# Patient Record
Sex: Male | Born: 1965
Health system: Southern US, Community
[De-identification: ages and names within clinical notes are randomized; demographics above are authoritative.]

## PROBLEM LIST (undated history)

## (undated) DIAGNOSIS — I10 Essential (primary) hypertension: Secondary | ICD-10-CM

## (undated) DIAGNOSIS — E785 Hyperlipidemia, unspecified: Secondary | ICD-10-CM

## (undated) DIAGNOSIS — N184 Chronic kidney disease, stage 4 (severe): Secondary | ICD-10-CM

## (undated) DIAGNOSIS — E119 Type 2 diabetes mellitus without complications: Secondary | ICD-10-CM

---

## 2017-12-20 ENCOUNTER — Encounter (HOSPITAL_BASED_OUTPATIENT_CLINIC_OR_DEPARTMENT_OTHER): Payer: Self-pay | Admitting: Emergency Medicine

## 2017-12-20 ENCOUNTER — Other Ambulatory Visit: Payer: Self-pay

## 2017-12-20 ENCOUNTER — Emergency Department (HOSPITAL_BASED_OUTPATIENT_CLINIC_OR_DEPARTMENT_OTHER): Payer: Medicaid Other

## 2017-12-20 ENCOUNTER — Inpatient Hospital Stay (HOSPITAL_BASED_OUTPATIENT_CLINIC_OR_DEPARTMENT_OTHER)
Admission: EM | Admit: 2017-12-20 | Discharge: 2017-12-22 | DRG: 638 | Disposition: A | Discharge status: Home / Self Care | Payer: Medicaid Other | Attending: Internal Medicine | Admitting: Internal Medicine

## 2017-12-20 DIAGNOSIS — N179 Acute kidney failure, unspecified: Secondary | ICD-10-CM | POA: Diagnosis present

## 2017-12-20 DIAGNOSIS — Z23 Encounter for immunization: Secondary | ICD-10-CM | POA: Diagnosis not present

## 2017-12-20 DIAGNOSIS — W19XXXA Unspecified fall, initial encounter: Secondary | ICD-10-CM | POA: Diagnosis present

## 2017-12-20 DIAGNOSIS — E440 Moderate protein-calorie malnutrition: Secondary | ICD-10-CM

## 2017-12-20 DIAGNOSIS — Z833 Family history of diabetes mellitus: Secondary | ICD-10-CM | POA: Diagnosis not present

## 2017-12-20 DIAGNOSIS — Z681 Body mass index (BMI) 19 or less, adult: Secondary | ICD-10-CM | POA: Diagnosis not present

## 2017-12-20 DIAGNOSIS — E1165 Type 2 diabetes mellitus with hyperglycemia: Secondary | ICD-10-CM | POA: Diagnosis present

## 2017-12-20 DIAGNOSIS — D509 Iron deficiency anemia, unspecified: Secondary | ICD-10-CM | POA: Diagnosis present

## 2017-12-20 DIAGNOSIS — R739 Hyperglycemia, unspecified: Secondary | ICD-10-CM | POA: Diagnosis present

## 2017-12-20 HISTORY — DX: Type 2 diabetes mellitus without complications: E11.9

## 2017-12-20 LAB — URINALYSIS, ROUTINE W REFLEX MICROSCOPIC
Bilirubin Urine: NEGATIVE
KETONES UR: NEGATIVE mg/dL
Nitrite: NEGATIVE
PROTEIN: 100 mg/dL — AB
Specific Gravity, Urine: 1.01 (ref 1.005–1.030)
pH: 5.5 (ref 5.0–8.0)

## 2017-12-20 LAB — CBG MONITORING, ED
GLUCOSE-CAPILLARY: 552 mg/dL — AB (ref 65–99)
Glucose-Capillary: 591 mg/dL (ref 65–99)
Glucose-Capillary: 477 mg/dL — ABNORMAL HIGH (ref 65–99)

## 2017-12-20 LAB — BASIC METABOLIC PANEL
ANION GAP: 9 (ref 5–15)
BUN: 80 mg/dL — ABNORMAL HIGH (ref 6–20)
CALCIUM: 8.4 mg/dL — AB (ref 8.9–10.3)
CO2: 16 mmol/L — AB (ref 22–32)
Chloride: 110 mmol/L (ref 101–111)
Creatinine, Ser: 2.61 mg/dL — ABNORMAL HIGH (ref 0.61–1.24)
GFR, EST AFRICAN AMERICAN: 31 mL/min — AB (ref >60)
GFR, EST NON AFRICAN AMERICAN: 27 mL/min — AB (ref >60)
Glucose, Bld: 336 mg/dL — ABNORMAL HIGH (ref 65–99)
Potassium: 3.7 mmol/L (ref 3.5–5.1)
SODIUM: 135 mmol/L (ref 135–145)

## 2017-12-20 LAB — COMPREHENSIVE METABOLIC PANEL
ALK PHOS: 153 U/L — AB (ref 38–126)
ALT: 21 U/L (ref 17–63)
ANION GAP: 9 (ref 5–15)
AST: 21 U/L (ref 15–41)
Albumin: 2.4 g/dL — ABNORMAL LOW (ref 3.5–5.0)
BILIRUBIN TOTAL: 0.2 mg/dL — AB (ref 0.3–1.2)
BUN: 84 mg/dL — ABNORMAL HIGH (ref 6–20)
CALCIUM: 8.4 mg/dL — AB (ref 8.9–10.3)
CO2: 17 mmol/L — ABNORMAL LOW (ref 22–32)
CREATININE: 2.79 mg/dL — AB (ref 0.61–1.24)
Chloride: 98 mmol/L — ABNORMAL LOW (ref 101–111)
GFR, EST AFRICAN AMERICAN: 29 mL/min — AB (ref >60)
GFR, EST NON AFRICAN AMERICAN: 25 mL/min — AB (ref >60)
Glucose, Bld: 617 mg/dL (ref 65–99)
Potassium: 3.9 mmol/L (ref 3.5–5.1)
Sodium: 124 mmol/L — ABNORMAL LOW (ref 135–145)
TOTAL PROTEIN: 6.5 g/dL (ref 6.5–8.1)

## 2017-12-20 LAB — URINALYSIS, MICROSCOPIC (REFLEX)

## 2017-12-20 LAB — CBC WITH DIFFERENTIAL/PLATELET
BASOS ABS: 0 10*3/uL (ref 0.0–0.1)
BASOS PCT: 1 %
EOS ABS: 0.7 10*3/uL (ref 0.0–0.7)
EOS PCT: 9 %
HCT: 31.1 % — ABNORMAL LOW (ref 39.0–52.0)
HEMOGLOBIN: 10.7 g/dL — AB (ref 13.0–17.0)
Lymphocytes Relative: 15 %
Lymphs Abs: 1.2 10*3/uL (ref 0.7–4.0)
MCH: 26.4 pg (ref 26.0–34.0)
MCHC: 34.4 g/dL (ref 30.0–36.0)
MCV: 76.6 fL — ABNORMAL LOW (ref 78.0–100.0)
Monocytes Absolute: 0.4 10*3/uL (ref 0.1–1.0)
Monocytes Relative: 5 %
NEUTROS PCT: 70 %
Neutro Abs: 5.7 10*3/uL (ref 1.7–7.7)
PLATELETS: 524 10*3/uL — AB (ref 150–400)
RBC: 4.06 MIL/uL — AB (ref 4.22–5.81)
RDW: 12.6 % (ref 11.5–15.5)
WBC: 8 10*3/uL (ref 4.0–10.5)

## 2017-12-20 LAB — PHOSPHORUS: Phosphorus: 3.6 mg/dL (ref 2.5–4.6)

## 2017-12-20 LAB — BRAIN NATRIURETIC PEPTIDE: B NATRIURETIC PEPTIDE 5: 74.6 pg/mL (ref 0.0–100.0)

## 2017-12-20 LAB — TROPONIN I: Troponin I: <0.03 ng/mL (ref <0.03)

## 2017-12-20 LAB — MAGNESIUM: MAGNESIUM: 1.8 mg/dL (ref 1.7–2.4)

## 2017-12-20 LAB — GLUCOSE, CAPILLARY: Glucose-Capillary: 374 mg/dL — ABNORMAL HIGH (ref 65–99)

## 2017-12-20 MED ORDER — HEPARIN SODIUM (PORCINE) 5000 UNIT/ML IJ SOLN
5000.0000 [IU] | Freq: Three times a day (TID) | INTRAMUSCULAR | Status: DC
  Administered 2017-12-21 – 2017-12-22 (×5): 5000 [IU] via SUBCUTANEOUS
  Filled 2017-12-20 (×5): qty 1

## 2017-12-20 MED ORDER — PNEUMOCOCCAL VAC POLYVALENT 25 MCG/0.5ML IJ INJ
0.5000 mL | INJECTION | INTRAMUSCULAR | Status: AC
  Administered 2017-12-21: 0.5 mL via INTRAMUSCULAR
  Filled 2017-12-20 (×2): qty 0.5

## 2017-12-20 MED ORDER — ACETAMINOPHEN 650 MG RE SUPP: 650.0000 mg | Freq: Four times a day (QID) | RECTAL | Status: DC | PRN

## 2017-12-20 MED ORDER — ONDANSETRON HCL 4 MG/2ML IJ SOLN: 4.0000 mg | Freq: Four times a day (QID) | INTRAMUSCULAR | Status: DC | PRN

## 2017-12-20 MED ORDER — DEXTROSE 50 % IV SOLN: 25.0000 mL | INTRAVENOUS | Status: DC | PRN

## 2017-12-20 MED ORDER — SODIUM CHLORIDE 0.9% FLUSH
3.0000 mL | Freq: Two times a day (BID) | INTRAVENOUS | Status: DC
  Administered 2017-12-21 – 2017-12-22 (×4): 3 mL via INTRAVENOUS

## 2017-12-20 MED ORDER — SODIUM CHLORIDE 0.9 % IV BOLUS (SEPSIS)
1000.0000 mL | Freq: Once | INTRAVENOUS | Status: AC
  Administered 2017-12-20: 1000 mL via INTRAVENOUS

## 2017-12-20 MED ORDER — SODIUM CHLORIDE 0.9 % IV SOLN
INTRAVENOUS | Status: DC
  Administered 2017-12-20: 18:00:00 via INTRAVENOUS

## 2017-12-20 MED ORDER — SODIUM CHLORIDE 0.9 % IV SOLN
INTRAVENOUS | Status: DC
  Administered 2017-12-20: 5.4 [IU]/h via INTRAVENOUS
  Filled 2017-12-20 (×2): qty 1

## 2017-12-20 MED ORDER — SODIUM CHLORIDE 0.9 % IV BOLUS (SEPSIS)
500.0000 mL | Freq: Once | INTRAVENOUS | Status: AC
  Administered 2017-12-20: 500 mL via INTRAVENOUS

## 2017-12-20 MED ORDER — ACETAMINOPHEN 325 MG PO TABS: 650.0000 mg | ORAL_TABLET | Freq: Four times a day (QID) | ORAL | Status: DC | PRN

## 2017-12-20 MED ORDER — ONDANSETRON HCL 4 MG PO TABS: 4.0000 mg | ORAL_TABLET | Freq: Four times a day (QID) | ORAL | Status: DC | PRN

## 2017-12-20 MED ORDER — DEXTROSE-NACL 5-0.45 % IV SOLN
INTRAVENOUS | Status: DC
  Administered 2017-12-21: via INTRAVENOUS

## 2017-12-20 NOTE — H&P (Signed)
[facility] History and Physical  Demont Schloesser ZOX:[id number] DOB: 09/04/1966 DOA: 12/20/2017  Referring physician:  PCP: Patient, No Pcp Per   Chief Complaint: "I got weak and fell down."  HPI: Majesty Wishart is a [age] y.o. male  No prev pmh present presented with weakness and fall.  Patient states that over the last several weeks he has gotten very weak.  He is also lost 30 pounds over the last year.  He has been suspicious that he might have diabetes.  He has a sister who has diabetes.  A fall the day of admission scared him.  Patient did not hit his head or fall violently.  Everything just got dark and patient got weak and he went to the ground.  ED course: Patient found to have glucose above 600.  Not in DKA.  Started on insulin drip.  Hospitalist consulted for admission.  Used language line, patient's primary language is Laotian   Review of Systems:  As per HPI otherwise 10 point review of systems negative.    Past Medical History:  Diagnosis Date  . Diabetes mellitus without complication (HCC)    History reviewed. No pertinent surgical history. Social History:  reports that he has never smoked. He has never used smokeless tobacco. His alcohol and drug histories are not on file.  No Known Allergies  No family history on file.   Prior to Admission medications   Not on File   Physical Exam: Vitals:   12/20/17 2030 12/20/17 2126 12/20/17 2129 12/20/17 2200  BP: (!) 150/88  138/85 (!) 146/96  Pulse: 88 94 99 95  Resp: 18 15 (!) 25 15  Temp:      TempSrc:      SpO2: 100% 99% 99% 100%  Weight:      Height:        Wt Readings from Last 3 Encounters:  12/20/17 39 kg (86 lb)    General:  Appears calm and comfortable; A&Ox3 Eyes:  PERRL, EOMI, normal lids, iris ENT:  grossly normal hearing, lips & tongue Neck:  no LAD, masses or thyromegaly Cardiovascular:  RRR, no m/r/g. No LE edema.  Respiratory:  CTA bilaterally, no w/r/r. Normal  respiratory effort. Abdomen:  soft, ntnd Skin:  no rash or induration seen on limited exam Musculoskeletal:  grossly normal tone BUE/BLE Psychiatric:  grossly normal mood and affect, speech fluent and appropriate Neurologic:  CN 2-12 grossly intact, moves all extremities in coordinated fashion.          Labs on Admission:  Basic Metabolic Panel: Recent Labs  Lab 12/20/17 1707  NA 124*  K 3.9  CL 98*  CO2 17*  GLUCOSE 617*  BUN 84*  CREATININE 2.79*  CALCIUM 8.4*  MG 1.8  PHOS 3.6   Liver Function Tests: Recent Labs  Lab 12/20/17 1707  AST 21  ALT 21  ALKPHOS 153*  BILITOT 0.2*  PROT 6.5  ALBUMIN 2.4*   No results for input(s): LIPASE, AMYLASE in the last 168 hours. No results for input(s): AMMONIA in the last 168 hours. CBC: Recent Labs  Lab 12/20/17 1707  WBC 8.0  NEUTROABS 5.7  HGB 10.7*  HCT 31.1*  MCV 76.6*  PLT 524*   Cardiac Enzymes: Recent Labs  Lab 12/20/17 1707  TROPONINI <0.03    BNP (last 3 results) Recent Labs    12/20/17 1707  BNP 74.6    ProBNP (last 3 results) No results for input(s): PROBNP in the last 8760 hours.  Serum creatinine: 2.79 mg/dL (H) 16/10/96 0454 Estimated creatinine clearance: 17.3 mL/min (A)  CBG: Recent Labs  Lab 12/20/17 1641 12/20/17 1816 12/20/17 1927 12/20/17 2039 12/20/17 2151  GLUCAP 591* >600* 552* 477* 374*    Radiological Exams on Admission: Dg Chest 2 View  Result Date: 12/20/2017 CLINICAL DATA:  Diabetes. EXAM: CHEST - 2 VIEW COMPARISON:  None. FINDINGS: The lungs are clear without focal pneumonia, edema, pneumothorax or pleural effusion. The cardiopericardial silhouette is within normal limits for size. The visualized bony structures of the thorax are intact. Telemetry leads overlie the chest. IMPRESSION: No active cardiopulmonary disease. Electronically Signed   By: Kennith Center M.D.   On: 12/20/2017 17:50    EKG: Independently reviewed. NSR.  Assessment/Plan Active Problems:    Acute hyperglycemia  Hyperglycemia Insulin drip protocol UA->Ucx pending CXR neg  AKI Baseline Cr unk, Cr on admit 2.79 1.5L of normal saline given in the emergency room Gentle hydration overnight Checking magnesium and phosphorus - wnl Urine labs ordered to calculate fractional excretion of Na  Fall PT eval  Low Na Neuro checks Corrected Na 132   Code Status: FC  DVT Prophylaxis: lovenox Family Communication: none available  Disposition Plan: Pending Improvement  Status: sdu inpt  Haydee Salter, MD Family Medicine [facility] [email or web address] Password [email or web address]

## 2017-12-20 NOTE — ED Notes (Signed)
Glucose 617 , given to ED MD

## 2017-12-20 NOTE — ED Notes (Signed)
Eating a bag of chex mix type snack, instructed on NPO until cleared by MD

## 2017-12-20 NOTE — ED Triage Notes (Addendum)
Pt daughter states he is a noncompliant diabetic. Daughter states pt has been declining over the past year but refuses to go to the hospital. He says he agreed to come today because he feels very weak and has difficulty walking. Denies pain. Also reports significant weight loss.

## 2017-12-20 NOTE — Plan of Care (Signed)
[facility] Acceptance Note  52yo M  PMH: DM  HPI: Pt also noted to have months of wt loss. Suspected he has had DM due to fmhx. Pt has also lost vision in one eye. Came in after pushed to come by dgtr.  ED Course: Given NS followed by hyperglycemia protocol and placed insulin drip. No gap.  Requested: Mg, Phos, 1L NS  Status: SDU, inpt

## 2017-12-20 NOTE — ED Notes (Signed)
Patient transported to X-ray 

## 2017-12-20 NOTE — ED Provider Notes (Addendum)
Emergency Department Provider Note   I have reviewed the triage vital signs and the nursing notes.   HISTORY  Chief Complaint Weakness and Hyperglycemia   HPI Nicholas Lucero is a 52 y.o. male with no known PMH presents to the emergency department for evaluation of progressively worsening generalized weakness, vomiting, unintentional weight loss, right eye blindness.  The patient has never been formally diagnosed with diabetes according to him and family.  Patient states he has a family history of diabetes and thought that he probably had it 3 years ago.  Over the last year he has become significantly worse in terms of weakness and unintentional weight loss.  He has never seen a doctor regarding these medical issues.  He denies any fevers or shaking chills.  No blood in the bowel movements.  No shortness of breath or chest pain.  He has never checked his blood sugar.   Past Medical History:  Diagnosis Date  . Diabetes mellitus without complication [facility](HCC)     Patient Active Problem List   Diagnosis Date Noted  . Acute hyperglycemia 12/20/2017    History reviewed. No pertinent surgical history.    Allergies Patient has no known allergies.  No family history on file.  Social History Social History   Tobacco Use  . Smoking status: Never Smoker  . Smokeless tobacco: Never Used  Substance Use Topics  . Alcohol use: Not on file  . Drug use: Not on file    Review of Systems  Constitutional: No fever/chills. Positive generalized weakness.  Eyes: Chronic right eye blindness.  ENT: No sore throat. Cardiovascular: Denies chest pain. Respiratory: Denies shortness of breath. Gastrointestinal: No abdominal pain. Positive nausea and vomiting.  No diarrhea.  No constipation. Genitourinary: Negative for dysuria. Positive urine frequency.  Musculoskeletal: Negative for back pain. Skin: Negative for rash. Neurological: Negative for headaches, focal weakness or  numbness.  10-point ROS otherwise negative.  ____________________________________________   PHYSICAL EXAM:  VITAL SIGNS: ED Triage Vitals  Enc Vitals Group     BP 12/20/17 1637 123/81     Pulse Rate 12/20/17 1637 91     Resp 12/20/17 1637 20     Temp 12/20/17 1637 97.6 F (36.4 C)     Temp Source 12/20/17 1637 Oral     SpO2 12/20/17 1637 97 %     Weight 12/20/17 1638 86 lb (39 kg)     Pain Score 12/20/17 1636 0    Constitutional: Alert and oriented. Patient is thin, frail, and weak-appearing.  Eyes: Conjunctivae are normal. Opaque right lens.  Head: Atraumatic. Nose: No congestion/rhinnorhea. Mouth/Throat: Mucous membranes are very dry.  Neck: No stridor.  Cardiovascular: Normal rate, regular rhythm. Good peripheral circulation. Grossly normal heart sounds.   Respiratory: Normal respiratory effort.  No retractions. Lungs CTAB. Gastrointestinal: Soft and nontender. No distention.  Musculoskeletal: No lower extremity tenderness nor edema. No gross deformities of extremities. Neurologic:  Normal speech and language. No gross focal neurologic deficits are appreciated.  Skin:  Skin is warm, dry and intact. No rash noted.   ____________________________________________   LABS (all labs ordered are listed, but only abnormal results are displayed)  Labs Reviewed  COMPREHENSIVE METABOLIC PANEL - Abnormal; Notable for the following components:      Result Value   Sodium 124 (*)    Chloride 98 (*)    CO2 17 (*)    Glucose, Bld 617 (*)    BUN 84 (*)    Creatinine, Ser 2.79 (*)  Calcium 8.4 (*)    Albumin 2.4 (*)    Alkaline Phosphatase 153 (*)    Total Bilirubin 0.2 (*)    GFR calc non Af Amer 25 (*)    GFR calc Af Amer 29 (*)    All other components within normal limits  CBC WITH DIFFERENTIAL/PLATELET - Abnormal; Notable for the following components:   RBC 4.06 (*)    Hemoglobin 10.7 (*)    HCT 31.1 (*)    MCV 76.6 (*)    Platelets 524 (*)    All other components  within normal limits  URINALYSIS, ROUTINE W REFLEX MICROSCOPIC - Abnormal; Notable for the following components:   Color, Urine STRAW (*)    APPearance CLOUDY (*)    Glucose, UA >=500 (*)    Hgb urine dipstick SMALL (*)    Protein, ur 100 (*)    Leukocytes, UA TRACE (*)    All other components within normal limits  URINALYSIS, MICROSCOPIC (REFLEX) - Abnormal; Notable for the following components:   Bacteria, UA MANY (*)    Squamous Epithelial / LPF 0-5 (*)    All other components within normal limits  GLUCOSE, CAPILLARY - Abnormal; Notable for the following components:   Glucose-Capillary 374 (*)    All other components within normal limits  BASIC METABOLIC PANEL - Abnormal; Notable for the following components:   CO2 16 (*)    Glucose, Bld 336 (*)    BUN 80 (*)    Creatinine, Ser 2.61 (*)    Calcium 8.4 (*)    GFR calc non Af Amer 27 (*)    GFR calc Af Amer 31 (*)    All other components within normal limits  BASIC METABOLIC PANEL - Abnormal; Notable for the following components:   Chloride 114 (*)    CO2 20 (*)    Glucose, Bld 102 (*)    BUN 78 (*)    Creatinine, Ser 2.45 (*)    Calcium 8.6 (*)    GFR calc non Af Amer 29 (*)    GFR calc Af Amer 33 (*)    All other components within normal limits  BASIC METABOLIC PANEL - Abnormal; Notable for the following components:   CO2 18 (*)    Glucose, Bld 149 (*)    BUN 73 (*)    Creatinine, Ser 2.39 (*)    Calcium 8.5 (*)    GFR calc non Af Amer 30 (*)    GFR calc Af Amer 34 (*)    All other components within normal limits  GLUCOSE, CAPILLARY - Abnormal; Notable for the following components:   Glucose-Capillary 254 (*)    All other components within normal limits  GLUCOSE, CAPILLARY - Abnormal; Notable for the following components:   Glucose-Capillary 132 (*)    All other components within normal limits  GLUCOSE, CAPILLARY - Abnormal; Notable for the following components:   Glucose-Capillary 183 (*)    All other  components within normal limits  GLUCOSE, CAPILLARY - Abnormal; Notable for the following components:   Glucose-Capillary 113 (*)    All other components within normal limits  GLUCOSE, CAPILLARY - Abnormal; Notable for the following components:   Glucose-Capillary 100 (*)    All other components within normal limits  GLUCOSE, CAPILLARY - Abnormal; Notable for the following components:   Glucose-Capillary 144 (*)    All other components within normal limits  CBC - Abnormal; Notable for the following components:   RBC 4.06 (*)  Hemoglobin 10.7 (*)    HCT 31.9 (*)    Platelets 562 (*)    All other components within normal limits  GLUCOSE, CAPILLARY - Abnormal; Notable for the following components:   Glucose-Capillary 136 (*)    All other components within normal limits  CBG MONITORING, ED - Abnormal; Notable for the following components:   Glucose-Capillary 591 (*)    All other components within normal limits  CBG MONITORING, ED - Abnormal; Notable for the following components:   Glucose-Capillary >600 (*)    All other components within normal limits  CBG MONITORING, ED - Abnormal; Notable for the following components:   Glucose-Capillary 552 (*)    All other components within normal limits  CBG MONITORING, ED - Abnormal; Notable for the following components:   Glucose-Capillary 477 (*)    All other components within normal limits  MRSA PCR SCREENING  BRAIN NATRIURETIC PEPTIDE  TROPONIN I  MAGNESIUM  PHOSPHORUS  SODIUM, URINE, RANDOM  CREATININE, URINE, RANDOM  GLUCOSE, CAPILLARY  HIV ANTIBODY (ROUTINE TESTING)  HEMOGLOBIN A1C   ____________________________________________  EKG   EKG Interpretation  Date/Time:  Sunday December 20 2017 17:34:24 EDT Ventricular Rate:  89 PR Interval:    QRS Duration: 66 QT Interval:  449 QTC Calculation: 547 R Axis:   89 Text Interpretation:  Sinus rhythm Borderline T abnormalities, diffuse leads Prolonged QT interval Baseline wander  in lead(s) V6 No STEMI.  Confirmed by Alona Bene [phone]) on 12/20/2017 5:55:59 PM Also confirmed by Alona Bene [phone]), editor Elita Quick [phone])  on 12/21/2017 7:34:49 AM       ____________________________________________  RADIOLOGY  Dg Chest 2 View  Result Date: 12/20/2017 CLINICAL DATA:  Diabetes. EXAM: CHEST - 2 VIEW COMPARISON:  None. FINDINGS: The lungs are clear without focal pneumonia, edema, pneumothorax or pleural effusion. The cardiopericardial silhouette is within normal limits for size. The visualized bony structures of the thorax are intact. Telemetry leads overlie the chest. IMPRESSION: No active cardiopulmonary disease. Electronically Signed   By: Kennith Center M.D.   On: 12/20/2017 17:50   US Renal  Result Date: 12/21/2017 CLINICAL DATA:  Acute kidney injury. EXAM: RENAL / URINARY TRACT ULTRASOUND COMPLETE COMPARISON:  None. FINDINGS: Right Kidney: Length: 13.0 cm. Increased parenchymal echogenicity. No mass or hydronephrosis visualized. Left Kidney: Length: 10.5 cm. Increased parenchymal echogenicity. No mass or hydronephrosis visualized. Bladder: Diffuse bladder wall thickening.  Debris in the bladder lumen. IMPRESSION: 1. Echogenic kidneys compatible with medical renal disease. No hydronephrosis. 2. Bladder wall thickening and debris, possibly reflecting infectious cystitis. Electronically Signed   By: Sebastian Ache M.D.   On: 12/21/2017 10:53    ____________________________________________   PROCEDURES  Procedure(s) performed:   Procedures  CRITICAL CARE Performed by: Maia Plan Total critical care time: 35 minutes Critical care time was exclusive of separately billable procedures and treating other patients. Critical care was necessary to treat or prevent imminent or life-threatening deterioration. Critical care was time spent personally by me on the following activities: development of treatment plan with patient and/or surrogate as well as nursing,  discussions with consultants, evaluation of patient's response to treatment, examination of patient, obtaining history from patient or surrogate, ordering and performing treatments and interventions, ordering and review of laboratory studies, ordering and review of radiographic studies, pulse oximetry and re-evaluation of patient's condition.  Alona Bene, MD Emergency Medicine  ____________________________________________   INITIAL IMPRESSION / ASSESSMENT AND PLAN / ED COURSE  Pertinent labs & imaging results that were available  during my care of the patient were reviewed by me and considered in my medical decision making (see chart for details).  Patient is a very thin man with generalized weakness and apparent significant applications from likely Malaquias Lenker-standing diabetes.  His blood sugar is significantly elevated here.  He does have a heart murmur on exam.  No abdominal tenderness or distention.  I suspect the most of his symptoms are related to untreated diabetes.  Plan for IV fluids, labs to rule out DKA and reassessment.  Patient with no DKA but CO2 is 17 and patient with severe DM symptoms and concern for possible developing DKA with nasuea/vomiting. Starting additional IVF and glucose infusion.   Discussed patient's case with Hospitalist, Dr. Melynda Ripple to request admission. Patient and family (if present) updated with plan. Care transferred to Hospitalist service.  I reviewed all nursing notes, vitals, pertinent old records, EKGs, labs, imaging (as available).  ____________________________________________  FINAL CLINICAL IMPRESSION(S) / ED DIAGNOSES  Final diagnoses:  Hyperglycemia     MEDICATIONS GIVEN DURING THIS VISIT:  Medications  dextrose 50 % solution 25 mL (has no administration in time range)  pneumococcal 23 valent vaccine (PNU-IMMUNE) injection 0.5 mL (has no administration in time range)  heparin injection 5,000 Units (5,000 Units Subcutaneous Given 12/21/17 0529)    sodium chloride flush (NS) 0.9 % injection 3 mL (3 mLs Intravenous Given 12/21/17 1058)  acetaminophen (TYLENOL) tablet 650 mg (has no administration in time range)    Or  acetaminophen (TYLENOL) suppository 650 mg (has no administration in time range)  ondansetron (ZOFRAN) tablet 4 mg (has no administration in time range)    Or  ondansetron (ZOFRAN) injection 4 mg (has no administration in time range)  insulin glargine (LANTUS) injection 10 Units (has no administration in time range)  insulin aspart (novoLOG) injection 0-15 Units (2 Units Subcutaneous Given 12/21/17 0800)  0.45 % sodium chloride infusion ( Intravenous New Bag/Given 12/21/17 1058)  sodium chloride 0.9 % bolus 500 mL (0 mLs Intravenous Stopped 12/20/17 1809)  sodium chloride 0.9 % bolus 1,000 mL (0 mLs Intravenous Stopped 12/20/17 2004)  insulin glargine (LANTUS) injection 5 Units (5 Units Subcutaneous Given 12/21/17 0529)    Note:  This document was prepared using Dragon voice recognition software and may include unintentional dictation errors.  Alona Bene, MD Emergency Medicine    Imberly Troxler, Arlyss Repress, MD 12/21/17 1113    Maia Plan, MD 12/29/17 [phone]

## 2017-12-21 ENCOUNTER — Inpatient Hospital Stay (HOSPITAL_COMMUNITY): Payer: Medicaid Other

## 2017-12-21 ENCOUNTER — Telehealth: Payer: Self-pay

## 2017-12-21 DIAGNOSIS — D509 Iron deficiency anemia, unspecified: Secondary | ICD-10-CM

## 2017-12-21 DIAGNOSIS — R739 Hyperglycemia, unspecified: Secondary | ICD-10-CM

## 2017-12-21 DIAGNOSIS — N179 Acute kidney failure, unspecified: Secondary | ICD-10-CM

## 2017-12-21 LAB — CBC
HCT: 31.9 % — ABNORMAL LOW (ref 39.0–52.0)
Hemoglobin: 10.7 g/dL — ABNORMAL LOW (ref 13.0–17.0)
MCH: 26.4 pg (ref 26.0–34.0)
MCHC: 33.5 g/dL (ref 30.0–36.0)
MCV: 78.6 fL (ref 78.0–100.0)
PLATELETS: 562 10*3/uL — AB (ref 150–400)
RBC: 4.06 MIL/uL — ABNORMAL LOW (ref 4.22–5.81)
RDW: 13.3 % (ref 11.5–15.5)
WBC: 9.8 10*3/uL (ref 4.0–10.5)

## 2017-12-21 LAB — GLUCOSE, CAPILLARY
GLUCOSE-CAPILLARY: 144 mg/dL — AB (ref 65–99)
GLUCOSE-CAPILLARY: 136 mg/dL — AB (ref 65–99)
GLUCOSE-CAPILLARY: 229 mg/dL — AB (ref 65–99)
GLUCOSE-CAPILLARY: 233 mg/dL — AB (ref 65–99)
Glucose-Capillary: 132 mg/dL — ABNORMAL HIGH (ref 65–99)
Glucose-Capillary: 254 mg/dL — ABNORMAL HIGH (ref 65–99)
Glucose-Capillary: 183 mg/dL — ABNORMAL HIGH (ref 65–99)
Glucose-Capillary: 93 mg/dL (ref 65–99)
Glucose-Capillary: 113 mg/dL — ABNORMAL HIGH (ref 65–99)
Glucose-Capillary: 100 mg/dL — ABNORMAL HIGH (ref 65–99)

## 2017-12-21 LAB — BASIC METABOLIC PANEL
ANION GAP: 8 (ref 5–15)
ANION GAP: 8 (ref 5–15)
BUN: 78 mg/dL — ABNORMAL HIGH (ref 6–20)
BUN: 73 mg/dL — ABNORMAL HIGH (ref 6–20)
CALCIUM: 8.6 mg/dL — AB (ref 8.9–10.3)
CALCIUM: 8.5 mg/dL — AB (ref 8.9–10.3)
CO2: 20 mmol/L — ABNORMAL LOW (ref 22–32)
CO2: 18 mmol/L — ABNORMAL LOW (ref 22–32)
Chloride: 114 mmol/L — ABNORMAL HIGH (ref 101–111)
Chloride: 111 mmol/L (ref 101–111)
Creatinine, Ser: 2.45 mg/dL — ABNORMAL HIGH (ref 0.61–1.24)
Creatinine, Ser: 2.39 mg/dL — ABNORMAL HIGH (ref 0.61–1.24)
GFR, EST AFRICAN AMERICAN: 33 mL/min — AB (ref >60)
GFR, EST AFRICAN AMERICAN: 34 mL/min — AB (ref >60)
GFR, EST NON AFRICAN AMERICAN: 29 mL/min — AB (ref >60)
GFR, EST NON AFRICAN AMERICAN: 30 mL/min — AB (ref >60)
Glucose, Bld: 102 mg/dL — ABNORMAL HIGH (ref 65–99)
Glucose, Bld: 149 mg/dL — ABNORMAL HIGH (ref 65–99)
Potassium: 3.6 mmol/L (ref 3.5–5.1)
Potassium: 3.5 mmol/L (ref 3.5–5.1)
Sodium: 142 mmol/L (ref 135–145)
Sodium: 137 mmol/L (ref 135–145)

## 2017-12-21 LAB — HEMOGLOBIN A1C
HEMOGLOBIN A1C: 18.4 % — AB (ref 4.8–5.6)
MEAN PLASMA GLUCOSE: 481.38 mg/dL

## 2017-12-21 LAB — SODIUM, URINE, RANDOM: SODIUM UR: 54 mmol/L

## 2017-12-21 LAB — MRSA PCR SCREENING: MRSA by PCR: NEGATIVE

## 2017-12-21 LAB — HIV ANTIBODY (ROUTINE TESTING W REFLEX): HIV SCREEN 4TH GENERATION: NONREACTIVE

## 2017-12-21 LAB — CREATININE, URINE, RANDOM: Creatinine, Urine: 28.64 mg/dL

## 2017-12-21 MED ORDER — INSULIN ASPART 100 UNIT/ML ~~LOC~~ SOLN: 0.0000 [IU] | SUBCUTANEOUS | Status: DC

## 2017-12-21 MED ORDER — SODIUM CHLORIDE 0.45 % IV SOLN
INTRAVENOUS | Status: AC
  Administered 2017-12-21 (×2): via INTRAVENOUS

## 2017-12-21 MED ORDER — INSULIN GLARGINE 100 UNIT/ML ~~LOC~~ SOLN
5.0000 [IU] | Freq: Once | SUBCUTANEOUS | Status: AC
  Administered 2017-12-21: 5 [IU] via SUBCUTANEOUS
  Filled 2017-12-21: qty 0.05

## 2017-12-21 MED ORDER — INSULIN STARTER KIT- SYRINGES (ENGLISH)
1.0000 | Freq: Once | Status: AC
  Administered 2017-12-21: 1
  Filled 2017-12-21: qty 1

## 2017-12-21 MED ORDER — INSULIN GLARGINE 100 UNIT/ML ~~LOC~~ SOLN
10.0000 [IU] | Freq: Every day | SUBCUTANEOUS | Status: DC
  Administered 2017-12-22: 10 [IU] via SUBCUTANEOUS
  Filled 2017-12-21: qty 0.1

## 2017-12-21 MED ORDER — INSULIN ASPART 100 UNIT/ML ~~LOC~~ SOLN
0.0000 [IU] | Freq: Three times a day (TID) | SUBCUTANEOUS | Status: DC
  Administered 2017-12-21 (×2): 5 [IU] via SUBCUTANEOUS
  Administered 2017-12-21: 2 [IU] via SUBCUTANEOUS
  Administered 2017-12-22: 3 [IU] via SUBCUTANEOUS

## 2017-12-21 MED ORDER — LIVING WELL WITH DIABETES BOOK
Freq: Once | Status: AC
  Administered 2017-12-21: 1
  Filled 2017-12-21: qty 1

## 2017-12-21 MED ORDER — HYDRALAZINE HCL 20 MG/ML IJ SOLN
10.0000 mg | Freq: Four times a day (QID) | INTRAMUSCULAR | Status: DC | PRN
  Administered 2017-12-21: 10 mg via INTRAVENOUS
  Filled 2017-12-21: qty 1

## 2017-12-21 NOTE — Progress Notes (Signed)
LCSW consulted for PCP needs and glucose equipment.   LCSW notified RNCM. RNCM will follow up for patient needs.   LCSW signing off. No CSW needs.   Nicholas Lucero, LSCW [facility] [phone]

## 2017-12-21 NOTE — Progress Notes (Signed)
Inpatient Diabetes Program Recommendations  AACE/ADA: New Consensus Statement on Inpatient Glycemic Control (2015)  Target Ranges:  Prepandial:   less than 140 mg/dL      Peak postprandial:   less than 180 mg/dL (1-2 hours)      Critically ill patients:  140 - 180 mg/dL   Lab Results  Component Value Date   GLUCAP 136 (H) 12/21/2017    Review of Glycemic Control  Diabetes history: New onset Outpatient Diabetes medications: None Current orders for Inpatient glycemic control: Lantus 10 units QD, Novolog 0-15 units tidwc  Recently lost 30 pounds. HgbA1C pending. Will likely need meal coverage insulin. Glucose 617 on admission.  Inpatient Diabetes Program Recommendations:     Novolog 3 units tidwc for meal coverage insulin if pt eats > 50% meal.  LM with pt's daughter regarding setting up time to meet with her and pt about his diabetes.  Continue to follow.  Thank you. Ailene Ardshonda Allona Gondek, RD, LDN, CDE Inpatient Diabetes Coordinator [phone]

## 2017-12-21 NOTE — Progress Notes (Signed)
Patient is transferred from ICU at 1330. Alert and oriented x4. Will monitor patient as protocol.

## 2017-12-21 NOTE — Evaluation (Signed)
Physical Therapy Evaluation Patient Details Name: Nicholas Lucero MRN: [id number] DOB: 04-20-1966 Today's Date: 12/21/2017   History of Present Illness  52 year old male from [place] who speaks limited English with the no previously diagnosed medical problems presented with complains of feeling weak and having unsteady gait at home with falls. Patient was found to have a blood glucose greater than 600  Clinical Impression  The patient  Demonstrates mild decreased balance  responses without UE support. Patient indicates some  Decreased sensation of the feet. Pt admitted with above diagnosis. Pt currently with functional limitations due to the deficits listed below (see PT Problem List).  Pt will benefit from skilled PT to increase their independence and safety with mobility to allow discharge to the venue listed below.       Follow Up Recommendations No PT follow up    Equipment Recommendations  Rolling walker with 5" wheels(TBD)    Recommendations for Other Services   OT    Precautions / Restrictions Precautions Precautions: Fall      Mobility  Bed Mobility Overal bed mobility: Independent                Transfers Overall transfer level: Needs assistance Equipment used: None Transfers: Sit to/from Stand Sit to Stand: Supervision            Ambulation/Gait Ambulation/Gait assistance: Min assist Ambulation Distance (Feet): 150 Feet Assistive device: None Gait Pattern/deviations: Drifts right/left;Staggering right;Staggering left     General Gait Details: at times, noted to stagger, steady assist  at times for balance  Stairs            Wheelchair Mobility    Modified Rankin (Stroke Patients Only)       Balance Overall balance assessment: Needs assistance Sitting-balance support: Feet supported;No upper extremity supported Sitting balance-Leahy Scale: Normal     Standing balance support: During functional activity;No upper extremity  supported Standing balance-Leahy Scale: Poor Standing balance comment: at times needs UE support                             Pertinent Vitals/Pain Pain Assessment: No/denies pain    Home Living Family/patient expects to be discharged to:: Private residence Living Arrangements: Other relatives Available Help at Discharge: Family Type of Home: House Home Access: Stairs to enter   Secretary/administrator of Steps: 2-3 Home Layout: One level Home Equipment: None      Prior Function                 Hand Dominance        Extremity/Trunk Assessment   Upper Extremity Assessment Upper Extremity Assessment: Generalized weakness    Lower Extremity Assessment Lower Extremity Assessment: Generalized weakness;RLE deficits/detail;LLE deficits/detail RLE Deficits / Details: reports some decreased sensation of feet LLE Deficits / Details: similar to right       Communication      Cognition Arousal/Alertness: Awake/alert Behavior During Therapy: WFL for tasks assessed/performed Overall Cognitive Status: Within Functional Limits for tasks assessed                                 General Comments: Ria Bush is dialect, understands some english      General Comments      Exercises     Assessment/Plan    PT Assessment Patient needs continued PT services  PT Problem List Decreased strength;Decreased activity tolerance;Decreased balance;Decreased  mobility;Decreased safety awareness;Impaired sensation       PT Treatment Interventions DME instruction;Gait training;Functional mobility training;Therapeutic activities;Patient/family education    PT Goals (Current goals can be found in the Care Plan section)  Acute Rehab PT Goals Patient Stated Goal: agreed to ambulate PT Goal Formulation: With patient Time For Goal Achievement: 12/28/17 Potential to Achieve Goals: Good    Frequency Min 3X/week   Barriers to discharge        Co-evaluation                AM-PAC PT "6 Clicks" Daily Activity  Outcome Measure Difficulty turning over in bed (including adjusting bedclothes, sheets and blankets)?: None Difficulty moving from lying on back to sitting on the side of the bed? : None Difficulty sitting down on and standing up from a chair with arms (e.g., wheelchair, bedside commode, etc,.)?: A Little Help needed moving to and from a bed to chair (including a wheelchair)?: A Little Help needed walking in hospital room?: A Lot Help needed climbing 3-5 steps with a railing? : Total 6 Click Score: 17    End of Session   Activity Tolerance: Patient tolerated treatment well Patient left: in bed;with call bell/phone within reach;with bed alarm set Nurse Communication: Mobility status PT Visit Diagnosis: Unsteadiness on feet (R26.81)    Time: [id number] PT Time Calculation (min) (ACUTE ONLY): 23 min   Charges:   PT Evaluation $PT Eval Low Complexity: 1 Low PT Treatments $Gait Training: 8-22 mins   PT G CodesBlanchard Kelch:        Jacoby Zanni PT [phone]    Rada HayHill, Cira Deyoe Elizabeth 12/21/2017, 6:16 PM

## 2017-12-21 NOTE — Progress Notes (Addendum)
Appointment at [facility] April 3rd at 10:30 AM. Pt may purchase medications there and glucose meter. There are no free glucose meters to give.  Pt and daughter are aware. Pt and daughter also was nicely, deeply, strongly encouraged to keep this appointment, because they are very hard to get.

## 2017-12-21 NOTE — Telephone Encounter (Signed)
Call received from Geni BersMyraette McGibboney, RN CM requesting a hospital follow up appointment for the patient at [facility].  An appointment was scheduled for 12/30/17 @ 1030 and the information was placed on the AVS.

## 2017-12-21 NOTE — Progress Notes (Signed)
[facility] PROGRESS NOTE  Nicholas Lucero:[id number]:[id number] DOB: 08/29/1966 DOA: 12/20/2017  PCP: Nicholas Lucero, No Pcp Per  Brief History/Interval Summary: 52 year old male from [place] who speaks limited English with the no previously diagnosed medical problems presented with complains of feeling weak and having unsteady gait at home with falls.  He had lost about 30 pounds as well.  It is unclear if the Nicholas Lucero knew if he had diabetes or not but apparently he had been "suspicious" about this.  Nicholas Lucero was found to have a blood glucose greater than 600.  Not noted to be in ketoacidosis.  He was started on an insulin infusion and admitted to the hospital.  Also found to have elevated creatinine.  Reason for Visit: Hyperglycemia..  Acute renal failure  Consultants: None  Procedures: None  Antibiotics: None  Subjective/Interval History: Nicholas Lucero states that he feels better this morning.  Denies any nausea vomiting.  Denies any abdominal pain.  ROS: No Shortness of breath  Objective:  Vital Signs  Vitals:   12/21/17 0400 12/21/17 0500 12/21/17 0600 12/21/17 0735  BP: (!) 161/81 135/74 (!) 151/82   Pulse: 88 82 86   Resp: (!) 22 17 17    Temp: 98.1 F (36.7 C)   98 F (36.7 C)  TempSrc: Oral   Oral  SpO2: 99% 99% 99%   Weight:      Height:        Intake/Output Summary (Last 24 hours) at 12/21/2017 1042 Last data filed at 12/21/2017 0900 Gross per 24 hour  Intake 2271.92 ml  Output 1050 ml  Net 1221.92 ml   Filed Weights   12/20/17 1638  Weight: 39 kg (86 lb)    General appearance: alert, cooperative, appears stated age and no distress Head: Normocephalic, without obvious abnormality, atraumatic Resp: clear to auscultation bilaterally Cardio: regular rate and rhythm, S1, S2 normal, no murmur, click, rub or gallop GI: soft, non-tender; bowel sounds normal; no masses,  no organomegaly Extremities: extremities normal, atraumatic, no cyanosis or  edema Neurologic: Awake alert.  Oriented x3.  Cranial nerves II through XII intact.  Motor strength equal bilateral upper and lower extremities.  No pronator drift.  Sensations intact bilaterally.  Gait not assessed.  Lab Results:  Data Reviewed: I have personally reviewed following labs and imaging studies  CBC: Recent Labs  Lab 12/20/17 1707 12/21/17 0300  WBC 8.0 9.8  NEUTROABS 5.7  --   HGB 10.7* 10.7*  HCT 31.1* 31.9*  MCV 76.6* 78.6  PLT 524* 562*    Basic Metabolic Panel: Recent Labs  Lab 12/20/17 1707 12/20/17 2252 12/21/17 0300 12/21/17 0625  NA 124* 135 142 137  K 3.9 3.7 3.6 3.5  CL 98* 110 114* 111  CO2 17* 16* 20* 18*  GLUCOSE 617* 336* 102* 149*  BUN 84* 80* 78* 73*  CREATININE 2.79* 2.61* 2.45* 2.39*  CALCIUM 8.4* 8.4* 8.6* 8.5*  MG 1.8  --   --   --   PHOS 3.6  --   --   --     GFR: Estimated Creatinine Clearance: 20.2 mL/min (A) (by C-G formula based on SCr of 2.39 mg/dL (H)).  Liver Function Tests: Recent Labs  Lab 12/20/17 1707  AST 21  ALT 21  ALKPHOS 153*  BILITOT 0.2*  PROT 6.5  ALBUMIN 2.4*    Cardiac Enzymes: Recent Labs  Lab 12/20/17 1707  TROPONINI <0.03    CBG: Recent Labs  Lab 12/21/17 0238 12/21/17 0335 12/21/17 0437 12/21/17 0604 12/21/17  0732  GLUCAP 93 100* 113* 144* 136*     Recent Results (from the past 240 hour(s))  MRSA PCR Screening     Status: None   Collection Time: 12/20/17  9:30 PM  Result Value Ref Range Status   MRSA by PCR NEGATIVE NEGATIVE Final    Comment:        The GeneXpert MRSA Assay (FDA approved for NASAL specimens only), is one component of a comprehensive MRSA colonization surveillance program. It is not intended to diagnose MRSA infection nor to guide or monitor treatment for MRSA infections. Performed at [facility], [address], [place], [place] [address]       Radiology Studies: Dg Chest 2 View  Result Date: 12/20/2017 CLINICAL DATA:   Diabetes. EXAM: CHEST - 2 VIEW COMPARISON:  None. FINDINGS: The lungs are clear without focal pneumonia, edema, pneumothorax or pleural effusion. The cardiopericardial silhouette is within normal limits for size. The visualized bony structures of the thorax are intact. Telemetry leads overlie the chest. IMPRESSION: No active cardiopulmonary disease. Electronically Signed   By: Kennith Center M.D.   On: 12/20/2017 17:50     Medications:  Scheduled: . heparin  5,000 Units Subcutaneous Q8H  . insulin aspart  0-15 Units Subcutaneous TID WC  . [START ON 12/22/2017] insulin glargine  10 Units Subcutaneous Daily  . pneumococcal 23 valent vaccine  0.5 mL Intramuscular Tomorrow-1000  . sodium chloride flush  3 mL Intravenous Q12H   Continuous: . sodium chloride     UEA:[id number] **OR** acetaminophen, dextrose, ondansetron **OR** ondansetron (ZOFRAN) IV  Assessment/Plan:  Active Problems:   Acute hyperglycemia    Hyperglycemia nonketotic Nicholas Lucero blood glucose level was greater than 600 at admission.  He did not have diabetic ketoacidosis.  He was placed on insulin infusion.  Transition to Lantus this morning.  Continue with sliding scale coverage.  Await HbA1c.  Diabetes coordinator to see.  Lipid panel.  Acute kidney injury normal anion gap metabolic acidosis Baseline renal function is unknown.  Creatinine was 2.79 on admission.  He has elevated BUN as well.  Some of this could be prerenal.  He will be continued on IV fluids.  Monitor urine output.  Renal ultrasound is pending.  Recheck labs tomorrow.  Urine sodium and creatinine were checked however it appears that these were sent off after Nicholas Lucero was given fluids.  Difficult to interpret but based on those values his FENa appears to be 3.78.  Microcytic anemia Baseline hemoglobin is not known.  No overt bleeding.  Check anemia panel.  Stool for occult blood.  He will need further workup as an outpatient.  Generalized weakness Possibly  due to hypoglycemia.  We will also follow-up on anemia panel.  We will check TSH.  PT evaluation.  Not have any focal neurological deficits.  Nicholas Lucero does not have a primary care provider.  Will discuss with case management regarding referring him to community health center.  He will need ongoing care for his diabetes and further workup for his other medical issues as mentioned above.  DVT Prophylaxis: Subcutaneous heparin    Code Status: Full code Family Communication: Discussed with the Nicholas Lucero Disposition Plan: Management as outlined above. PT evaluation.  Okay for transfer to floor.    LOS: 1 day   Osvaldo Shipper  [facility] Pager [phone] 12/21/2017, 10:42 AM  If 7PM-7AM, please contact night-coverage at [email or web address], password [facility]

## 2017-12-22 DIAGNOSIS — E440 Moderate protein-calorie malnutrition: Secondary | ICD-10-CM

## 2017-12-22 LAB — LIPID PANEL
CHOLESTEROL: 227 mg/dL — AB (ref 0–200)
HDL: 49 mg/dL (ref >40)
LDL Cholesterol: 148 mg/dL — ABNORMAL HIGH (ref 0–99)
Total CHOL/HDL Ratio: 4.6 RATIO
Triglycerides: 148 mg/dL (ref <150)
VLDL: 30 mg/dL (ref 0–40)

## 2017-12-22 LAB — VITAMIN B12: VITAMIN B 12: 1063 pg/mL — AB (ref 180–914)

## 2017-12-22 LAB — BASIC METABOLIC PANEL
ANION GAP: 8 (ref 5–15)
BUN: 61 mg/dL — ABNORMAL HIGH (ref 6–20)
CALCIUM: 8.2 mg/dL — AB (ref 8.9–10.3)
CO2: 18 mmol/L — ABNORMAL LOW (ref 22–32)
Chloride: 109 mmol/L (ref 101–111)
Creatinine, Ser: 2.33 mg/dL — ABNORMAL HIGH (ref 0.61–1.24)
GFR, EST AFRICAN AMERICAN: 36 mL/min — AB (ref >60)
GFR, EST NON AFRICAN AMERICAN: 31 mL/min — AB (ref >60)
GLUCOSE: 221 mg/dL — AB (ref 65–99)
Potassium: 4 mmol/L (ref 3.5–5.1)
SODIUM: 135 mmol/L (ref 135–145)

## 2017-12-22 LAB — CBC
HCT: 29.2 % — ABNORMAL LOW (ref 39.0–52.0)
Hemoglobin: 9.7 g/dL — ABNORMAL LOW (ref 13.0–17.0)
MCH: 25.9 pg — ABNORMAL LOW (ref 26.0–34.0)
MCHC: 33.2 g/dL (ref 30.0–36.0)
MCV: 77.9 fL — AB (ref 78.0–100.0)
PLATELETS: 492 10*3/uL — AB (ref 150–400)
RBC: 3.75 MIL/uL — AB (ref 4.22–5.81)
RDW: 13.7 % (ref 11.5–15.5)
WBC: 7.7 10*3/uL (ref 4.0–10.5)

## 2017-12-22 LAB — RETICULOCYTES
RBC.: 3.75 MIL/uL — ABNORMAL LOW (ref 4.22–5.81)
RETIC CT PCT: 2.2 % (ref 0.4–3.1)
Retic Count, Absolute: 82.5 10*3/uL (ref 19.0–186.0)

## 2017-12-22 LAB — IRON AND TIBC
Iron: 75 ug/dL (ref 45–182)
SATURATION RATIOS: 34 % (ref 17.9–39.5)
TIBC: 221 ug/dL — AB (ref 250–450)
UIBC: 146 ug/dL

## 2017-12-22 LAB — TSH: TSH: 2.068 u[IU]/mL (ref 0.350–4.500)

## 2017-12-22 LAB — GLUCOSE, CAPILLARY
GLUCOSE-CAPILLARY: 199 mg/dL — AB (ref 65–99)
Glucose-Capillary: 187 mg/dL — ABNORMAL HIGH (ref 65–99)
Glucose-Capillary: 229 mg/dL — ABNORMAL HIGH (ref 65–99)

## 2017-12-22 LAB — FERRITIN: Ferritin: 202 ng/mL (ref 24–336)

## 2017-12-22 LAB — FOLATE: Folate: 15.2 ng/mL (ref >5.9)

## 2017-12-22 MED ORDER — GLUCERNA SHAKE PO LIQD
237.0000 mL | Freq: Two times a day (BID) | ORAL | Status: DC
  Filled 2017-12-22: qty 237

## 2017-12-22 MED ORDER — ADULT MULTIVITAMIN W/MINERALS CH: 1.0000 | ORAL_TABLET | Freq: Every day | ORAL | 0 refills | Status: AC

## 2017-12-22 MED ORDER — SODIUM BICARBONATE 650 MG PO TABS: 650.0000 mg | ORAL_TABLET | Freq: Two times a day (BID) | ORAL | 1 refills | Status: DC

## 2017-12-22 MED ORDER — ATORVASTATIN CALCIUM 10 MG PO TABS: 10.0000 mg | ORAL_TABLET | Freq: Every day | ORAL | 0 refills | Status: DC

## 2017-12-22 MED ORDER — INSULIN NPH ISOPHANE & REGULAR (70-30) 100 UNIT/ML ~~LOC~~ SUSP: 8.0000 [IU] | Freq: Two times a day (BID) | SUBCUTANEOUS | 3 refills | Status: DC

## 2017-12-22 MED ORDER — "INSULIN SYRINGE/NEEDLE 27G X 1/2"" 0.5 ML MISC": 3 refills | Status: DC

## 2017-12-22 MED ORDER — BLOOD GLUCOSE MONITOR KIT: PACK | 0 refills | Status: DC

## 2017-12-22 NOTE — Discharge Summary (Signed)
[facility]  Physician Discharge Summary   Patient ID: Nicholas Lucero MRN: [id number] DOB/AGE: [birth year] 52 y.o.  Admit date: 12/20/2017 Discharge date: 12/22/2017  PCP: Patient, No Pcp Per  DISCHARGE DIAGNOSES:  Active Problems:   Acute hyperglycemia   Malnutrition of moderate degree   RECOMMENDATIONS FOR OUTPATIENT FOLLOW UP: 1. Patient has been made an appointment in the community health and wellness clinic next week. 2. He has been given prescriptions for insulin and glucometer. 3. He will need to undergo blood work at follow-up to assess his creatinine/renal function   DISCHARGE CONDITION: fair  Diet recommendation: Modified carbohydrate  Filed Weights   12/20/17 1638 12/22/17 0432  Weight: 39 kg (86 lb) 40 kg (88 lb 2.9 oz)    INITIAL HISTORY: 52 year old male from [place] who speaks limited English with the no previously diagnosed medical problems presented with complains of feeling weak and having unsteady gait at home with falls.  He had lost about 30 pounds as well.  It is unclear if the patient knew if he had diabetes or not but apparently he had been "suspicious" about this.  Patient was found to have a blood glucose greater than 600.  Not noted to be in ketoacidosis.  He was started on an insulin infusion and admitted to the hospital.  Also found to have elevated creatinine.   HOSPITAL COURSE:   Newly diagnosed diabetes mellitus type 2 with renal complications Patient blood glucose level was greater than 600 at admission.  He did not have diabetic ketoacidosis.  He was placed on insulin infusion.  He was transitioned to Lantus.  HbA1c was 18.   LDL was noted to be greater than 140.  He will be started on low-dose statin.  No ACE inhibitor due to renal dysfunction.  Due to cost issues he will be discharged on 70/30 insulin as recommended by diabetes coordinator.  He has follow-up at the community health and wellness center next week.    Likely  chronic kidney disease unknown stage with normal anion gap metabolic acidosis Baseline renal function is unknown.  Creatinine was 2.79 on admission.  He has elevated BUN as well.  Some of this could be prerenal.  Patient was given IV fluids.  There was mild improvement in his renal function.  He is making urine.  He likely has chronic kidney disease.  Renal ultrasound did not show any hydronephrosis but did suggest medical renal disease.  He will need continued outpatient follow-up and monitoring.  May benefit from referral to nephrology.  He will also be discharged on sodium bicarbonate tablets.  Microcytic anemia Ferritin is 202.  B12 level 1063.  Folate 15.2.  Further evaluation for anemia as outpatient.  No overt bleeding noted.  Generalized weakness Possibly due to hypoglycemia.    Seen by physical therapy.  Rolling walker will be ordered.  TSH is normal.  Overall stable.  Okay for discharge home today.   PERTINENT LABS:  The results of significant diagnostics from this hospitalization (including imaging, microbiology, ancillary and laboratory) are listed below for reference.    Microbiology: Recent Results (from the past 240 hour(s))  MRSA PCR Screening     Status: None   Collection Time: 12/20/17  9:30 PM  Result Value Ref Range Status   MRSA by PCR NEGATIVE NEGATIVE Final    Comment:        The GeneXpert MRSA Assay (FDA approved for NASAL specimens only), is one component of a comprehensive MRSA colonization surveillance program. It  is not intended to diagnose MRSA infection nor to guide or monitor treatment for MRSA infections. Performed at [facility], [place] [address] [place], [place] [address]      Labs: Basic Metabolic Panel: Recent Labs  Lab 12/20/17 1707 12/20/17 2252 12/21/17 0300 12/21/17 0625 12/22/17 0705  NA 124* 135 142 137 135  K 3.9 3.7 3.6 3.5 4.0  CL 98* 110 114* 111 109  CO2 17* 16* 20* 18* 18*  GLUCOSE 617* 336* 102*  149* 221*  BUN 84* 80* 78* 73* 61*  CREATININE 2.79* 2.61* 2.45* 2.39* 2.33*  CALCIUM 8.4* 8.4* 8.6* 8.5* 8.2*  MG 1.8  --   --   --   --   PHOS 3.6  --   --   --   --    Liver Function Tests: Recent Labs  Lab 12/20/17 1707  AST 21  ALT 21  ALKPHOS 153*  BILITOT 0.2*  PROT 6.5  ALBUMIN 2.4*   CBC: Recent Labs  Lab 12/20/17 1707 12/21/17 0300 12/22/17 0705  WBC 8.0 9.8 7.7  NEUTROABS 5.7  --   --   HGB 10.7* 10.7* 9.7*  HCT 31.1* 31.9* 29.2*  MCV 76.6* 78.6 77.9*  PLT 524* 562* 492*   Cardiac Enzymes: Recent Labs  Lab 12/20/17 1707  TROPONINI <0.03   BNP: BNP (last 3 results) Recent Labs    12/20/17 1707  BNP 74.6    CBG: Recent Labs  Lab 12/21/17 0732 12/21/17 1200 12/21/17 1617 12/21/17 2135 12/22/17 0727  GLUCAP 136* 229* 233* 187* 199*     IMAGING STUDIES Dg Chest 2 View  Result Date: 12/20/2017 CLINICAL DATA:  Diabetes. EXAM: CHEST - 2 VIEW COMPARISON:  None. FINDINGS: The lungs are clear without focal pneumonia, edema, pneumothorax or pleural effusion. The cardiopericardial silhouette is within normal limits for size. The visualized bony structures of the thorax are intact. Telemetry leads overlie the chest. IMPRESSION: No active cardiopulmonary disease. Electronically Signed   By: Misty Stanley M.D.   On: 12/20/2017 17:50   US Renal  Result Date: 12/21/2017 CLINICAL DATA:  Acute kidney injury. EXAM: RENAL / URINARY TRACT ULTRASOUND COMPLETE COMPARISON:  None. FINDINGS: Right Kidney: Length: 13.0 cm. Increased parenchymal echogenicity. No mass or hydronephrosis visualized. Left Kidney: Length: 10.5 cm. Increased parenchymal echogenicity. No mass or hydronephrosis visualized. Bladder: Diffuse bladder wall thickening.  Debris in the bladder lumen. IMPRESSION: 1. Echogenic kidneys compatible with medical renal disease. No hydronephrosis. 2. Bladder wall thickening and debris, possibly reflecting infectious cystitis. Electronically Signed   By: Logan Bores M.D.   On: 12/21/2017 10:53    DISCHARGE EXAMINATION: Vitals:   12/21/17 1345 12/21/17 2023 12/22/17 0431 12/22/17 0432  BP: 119/63 121/70 136/81   Pulse: 96 100 97   Resp: 20 20 18    Temp: 98.2 F (36.8 C) 98.5 F (36.9 C) 98.3 F (36.8 C)   TempSrc: Oral Oral Oral   SpO2: 99% 98% 98%   Weight:    40 kg (88 lb 2.9 oz)  Height:       General appearance: alert, cooperative, appears stated age and no distress Resp: clear to auscultation bilaterally Cardio: regular rate and rhythm, S1, S2 normal, no murmur, click, rub or gallop GI: soft, non-tender; bowel sounds normal; no masses,  no organomegaly Extremities: extremities normal, atraumatic, no cyanosis or edema  DISPOSITION: Home with daughter  Discharge Instructions    Call MD for:  difficulty breathing, headache or visual disturbances  Complete by:  As directed    Call MD for:  extreme fatigue   Complete by:  As directed    Call MD for:  persistant dizziness or light-headedness   Complete by:  As directed    Call MD for:  persistant nausea and vomiting   Complete by:  As directed    Call MD for:  severe uncontrolled pain   Complete by:  As directed    Call MD for:  temperature >100.4   Complete by:  As directed    Discharge instructions   Complete by:  As directed    Drink plenty of fluids.  Take your medications as prescribed.  Check your blood glucose levels 3 times a day before each meal and maintain a log for your doctor.  Please be sure to keep your appointment next week.  Some of your blood work results are not available yet.  The doctor should be able to look these up at follow-up.  Including your iron levels and B12 levels.  You will need blood work at follow-up to check your kidney function.  If your kidney function does not improve over the next several weeks then you may need to be referred to a kidney doctor.  You were cared for by a hospitalist during your hospital stay. If you have any questions about  your discharge medications or the care you received while you were in the hospital after you are discharged, you can call the unit and asked to speak with the hospitalist on call if the hospitalist that took care of you is not available. Once you are discharged, your primary care physician will handle any further medical issues. Please note that NO REFILLS for any discharge medications will be authorized once you are discharged, as it is imperative that you return to your primary care physician (or establish a relationship with a primary care physician if you do not have one) for your aftercare needs so that they can reassess your need for medications and monitor your lab values. If you do not have a primary care physician, you can call [phone] for a physician referral.   Increase activity slowly   Complete by:  As directed         Allergies as of 12/22/2017   No Known Allergies     Medication List    TAKE these medications   atorvastatin 10 MG tablet Commonly known as:  LIPITOR Take 1 tablet (10 mg total) by mouth daily.   blood glucose meter kit and supplies Kit Dispense based on patient and insurance preference. Use up to four times daily as directed. (FOR ICD-9 250.00, 250.01).   INS SYRINGE/NEEDLE .5CC/27G 27G X 1/2" 0.5 ML Misc Use as directed   insulin NPH-regular Human (70-30) 100 UNIT/ML injection Commonly known as:  NOVOLIN 70/30 Inject 8 Units into the skin 2 (two) times daily with a meal.   multivitamin with minerals Tabs tablet Take 1 tablet by mouth daily.   sodium bicarbonate 650 MG tablet Take 1 tablet (650 mg total) by mouth 2 (two) times daily.            Durable Medical Equipment  (From admission, onward)        Start     Ordered   12/22/17 1018  For home use only DME Walker rolling  Once    Question:  Patient needs a walker to treat with the following condition  Answer:  Unsteady gait   12/22/17 1018  Follow-up Information    [facility] Go on 12/30/2017.   Why:  at 10:30 am for a hospital follow up appointment.  Contact information: [place] [phone] [phone]          TOTAL DISCHARGE TIME: 42 mins  Bonnielee Haff  [facility] Pager [phone]  12/22/2017, 1:52 PM

## 2017-12-22 NOTE — Progress Notes (Signed)
Inpatient Diabetes Program Recommendations  AACE/ADA: New Consensus Statement on Inpatient Glycemic Control (2015)  Target Ranges:  Prepandial:   less than 140 mg/dL      Peak postprandial:   less than 180 mg/dL (1-2 hours)      Critically ill patients:  140 - 180 mg/dL   Lab Results  Component Value Date   GLUCAP 199 (H) 12/22/2017   HGBA1C 18.4 (H) 12/21/2017    Review of Glycemic Control   Spoke with patient about new diabetes diagnosis.  Discussed A1C results (18.4%) and explained what an A1C is. Discussed basic pathophysiology of DM Type 2, basic home care, importance of checking CBGs and maintaining good CBG control to prevent long-term and short-term complications. Reviewed glucose and A1C goals and explained that patient will need to continue to  Reviewed signs and symptoms of hyperglycemia and hypoglycemia along with treatment for both. Discussed impact of nutrition, exercise, stress, sickness, and medications on diabetes control.  Pt is able to give himself insulin and check his blood sugars. Feels confidant in giving his insulin and will [facility].   To be discharged on 70/30 8 units bid. Will need prescription for meter and supplies. Insulin syringes.  [facility] as [facility], as pt does not have insurance. Answered questions.  Discussed above with MD.  Thank you. Ailene Ardshonda Calli Bashor, RD, LDN, CDE Inpatient Diabetes Coordinator [phone]

## 2017-12-22 NOTE — Progress Notes (Addendum)
Initial Nutrition Assessment  DOCUMENTATION CODES:   Underweight, Non-severe (moderate) malnutrition in context of chronic illness  INTERVENTION:   Glucerna po BID, each supplement provides 220 calories and 10 grams protein    NUTRITION DIAGNOSIS:   Moderate Malnutrition related to chronic illness(long-term untreated/undiagnosed DM) as evidenced by mild fat depletion, moderate muscle depletion.   GOAL:   Patient will meet greater than or equal to 90% of their needs   MONITOR:   PO intake, Supplement acceptance, Weight trends  REASON FOR ASSESSMENT:   Malnutrition Screening Tool    ASSESSMENT:    52 yo male admitted 3/24 with acute non-ketotic hyperglycemia, acidosis and renal failure secondary to undiagnosed DM, microcytic anemia.  Spoke with RN who reports that pt is eating well and doing better today  Spoke with pt who reports:   He has been gradually losing weight over the past 2-3 years. He used to weigh 110 lb, but does not recall exact date  Believes that wt loss due in part to decreased appetite; only eats one big meal (traditional Vietnamese noodle bowl with chicken)  His appetite is much better now that he is in the hospital; he consumed 100% of breakfast  Denies N/V GI issues  He is concerned about paying for insulin after discharge since he does not have insurance  Learned how to administer insulin to himself  The importance of eating adequate calories with appropriate amounts/types of carbohydrates, consuming adequate protein, eating more frequent meals rather than one big meal was discussed with pt. Pt was receptive to basic information; he would benefit from additional diabetes educations; however, pt did confirm that his daughter was with him during initial diabetes education and she has the booklet of information provided for post-discharge DM management.    Medications: insulin MS & lantus  Labs: CBG's 229, 233, 199  NUTRITION - FOCUSED  PHYSICAL EXAM:    Most Recent Value  Orbital Region  No depletion  Upper Arm Region  Mild depletion  Thoracic and Lumbar Region  No depletion  Buccal Region  No depletion  Temple Region  Mild depletion  Clavicle Bone Region  No depletion  Clavicle and Acromion Bone Region  Moderate depletion  Scapular Bone Region  Moderate depletion  Dorsal Hand  No depletion  Patellar Region  Moderate depletion  Anterior Thigh Region  Moderate depletion  Posterior Calf Region  Moderate depletion  Edema (RD Assessment)  None  Hair  Reviewed  Eyes  Reviewed  Mouth  Reviewed  Skin  Reviewed  Nails  Reviewed       Diet Order:  Diet Carb Modified Fluid consistency: Thin; Room service appropriate? Yes  EDUCATION NEEDS:   Education needs have been addressed  Skin:  Skin Assessment: Reviewed RN Assessment  Last BM:  3/25  Height:   Ht Readings from Last 1 Encounters:  12/20/17 5\' 1"  (1.549 m)    Weight:   Wt Readings from Last 1 Encounters:  12/22/17 88 lb 2.9 oz (40 kg)    Ideal Body Weight:  50.8 kg  BMI:  Body mass index is 16.66 kg/m.  Estimated Nutritional Needs:   Kcal:  1500-1700 kcal (38-43 kcal/kg)  Protein:  75-85 grams (20% kcal)  Fluid:  >=1.6 L/day    Nicholas Skiffherie N. Taisha Pennebaker Dietetic Intern Pager: [phone]

## 2017-12-22 NOTE — Progress Notes (Signed)
Patient has discharged to home on 12/22/17. Discharge instruction including medication and appointment was given to patient. RN performed education about self care and diabetic. No question at this time

## 2017-12-22 NOTE — Discharge Instructions (Signed)

## 2017-12-30 ENCOUNTER — Ambulatory Visit: Payer: Medicaid Other | Attending: Critical Care Medicine | Admitting: Critical Care Medicine

## 2017-12-30 ENCOUNTER — Encounter: Payer: Self-pay | Admitting: Critical Care Medicine

## 2017-12-30 VITALS — BP 205/119 | HR 103 | Temp 97.4°F | Resp 18 | Ht 61.0 in | Wt 99.0 lb

## 2017-12-30 DIAGNOSIS — R32 Unspecified urinary incontinence: Secondary | ICD-10-CM | POA: Diagnosis not present

## 2017-12-30 DIAGNOSIS — I10 Essential (primary) hypertension: Secondary | ICD-10-CM | POA: Diagnosis not present

## 2017-12-30 DIAGNOSIS — E1129 Type 2 diabetes mellitus with other diabetic kidney complication: Secondary | ICD-10-CM | POA: Insufficient documentation

## 2017-12-30 DIAGNOSIS — E119 Type 2 diabetes mellitus without complications: Secondary | ICD-10-CM

## 2017-12-30 DIAGNOSIS — H5461 Unqualified visual loss, right eye, normal vision left eye: Secondary | ICD-10-CM | POA: Diagnosis not present

## 2017-12-30 DIAGNOSIS — Z79899 Other long term (current) drug therapy: Secondary | ICD-10-CM | POA: Diagnosis not present

## 2017-12-30 DIAGNOSIS — E11649 Type 2 diabetes mellitus with hypoglycemia without coma: Secondary | ICD-10-CM | POA: Insufficient documentation

## 2017-12-30 DIAGNOSIS — Z794 Long term (current) use of insulin: Secondary | ICD-10-CM | POA: Insufficient documentation

## 2017-12-30 DIAGNOSIS — R739 Hyperglycemia, unspecified: Secondary | ICD-10-CM

## 2017-12-30 DIAGNOSIS — E440 Moderate protein-calorie malnutrition: Secondary | ICD-10-CM | POA: Insufficient documentation

## 2017-12-30 DIAGNOSIS — E118 Type 2 diabetes mellitus with unspecified complications: Secondary | ICD-10-CM

## 2017-12-30 DIAGNOSIS — D509 Iron deficiency anemia, unspecified: Secondary | ICD-10-CM | POA: Insufficient documentation

## 2017-12-30 LAB — GLUCOSE, POCT (MANUAL RESULT ENTRY): POC Glucose: 573 mg/dl — AB (ref 70–99)

## 2017-12-30 MED ORDER — ATORVASTATIN CALCIUM 10 MG PO TABS: 10.0000 mg | ORAL_TABLET | Freq: Every day | ORAL | 6 refills | Status: AC

## 2017-12-30 MED ORDER — "INSULIN SYRINGE/NEEDLE 27G X 1/2"" 0.5 ML MISC": 3 refills | Status: AC

## 2017-12-30 MED ORDER — CLONIDINE HCL 0.1 MG PO TABS: 0.1000 mg | ORAL_TABLET | Freq: Three times a day (TID) | ORAL | 4 refills | Status: AC

## 2017-12-30 MED ORDER — INSULIN ASPART 100 UNIT/ML ~~LOC~~ SOLN
15.0000 [IU] | Freq: Once | SUBCUTANEOUS | Status: AC
  Administered 2017-12-30: 15 [IU] via SUBCUTANEOUS

## 2017-12-30 MED ORDER — INSULIN NPH ISOPHANE & REGULAR (70-30) 100 UNIT/ML ~~LOC~~ SUSP: 10.0000 [IU] | Freq: Two times a day (BID) | SUBCUTANEOUS | 3 refills | Status: AC

## 2017-12-30 MED ORDER — BLOOD GLUCOSE MONITOR KIT: PACK | 0 refills | Status: AC

## 2017-12-30 MED ORDER — CLONIDINE HCL 0.2 MG PO TABS
0.2000 mg | ORAL_TABLET | Freq: Once | ORAL | Status: AC
Start: 2017-12-30 — End: 2017-12-30
  Administered 2017-12-30: 0.2 mg via ORAL

## 2017-12-30 MED ORDER — CLONIDINE HCL 0.1 MG PO TABS
0.1000 mg | ORAL_TABLET | Freq: Once | ORAL | Status: AC
  Administered 2017-12-30: 0.1 mg via ORAL

## 2017-12-30 MED ORDER — SODIUM BICARBONATE 650 MG PO TABS: 650.0000 mg | ORAL_TABLET | Freq: Two times a day (BID) | ORAL | 3 refills | Status: AC

## 2017-12-30 MED ORDER — AMLODIPINE BESYLATE 10 MG PO TABS: 10.0000 mg | ORAL_TABLET | Freq: Every day | ORAL | 3 refills | Status: AC

## 2017-12-30 MED FILL — !NOVOLIN 70/30 100 UNITS/ML: (70-30) 100 | 28 days supply | Qty: 10 | Fill #0

## 2017-12-30 MED FILL — TRUE METRIX TEST STRIP: 25 days supply | Qty: 100 | Fill #0

## 2017-12-30 MED FILL — TRUEplus LANCETS 28G MISC: 25 days supply | Qty: 100 | Fill #0

## 2017-12-30 MED FILL — SODIUM BICARB 650 MG TABLET: 650 | 30 days supply | Qty: 60 | Fill #0

## 2017-12-30 MED FILL — ATORVASTATIN 10 MG TABLET: 10 | 30 days supply | Qty: 30 | Fill #0

## 2017-12-30 MED FILL — AMLODIPINE BESYLATE 10 MG T: 10 | 30 days supply | Qty: 30 | Fill #0

## 2017-12-30 MED FILL — !TRUE METRIX BLOOD GLUCOSE: 365 days supply | Qty: 1 | Fill #0

## 2017-12-30 MED FILL — cloNIDine HCL 0.1 MG TABS: 0.1 | 30 days supply | Qty: 90 | Fill #0

## 2017-12-30 NOTE — Progress Notes (Signed)
Subjective:    Patient ID: Nicholas Lucero, male    DOB: 18-Oct-1965, 52 y.o.   MRN: [id number]  52 y.o.M  DM2,  BP high today,  Not yet started insulin.  Newly diagnosed.  [place], here since 57.  Lives in [place],  No PCP.  No recent hospital stays until 3/24 to 3/26.  Pt developed progressive weakness and incontinence noted.  Pt Rx insulin drip then transitioned to insulin sq but never filled this on d/c.  Today CBG is > 500 not recordable. BP 220/125 as well.   Pt denies dyspnea, still weak,  Difficult time going up stairs.  No pain.  No headaches  Diabetes  He presents for his initial diabetic visit. He has type 2 diabetes mellitus. No MedicAlert identification noted. His disease course has been worsening. Hypoglycemia symptoms include nervousness/anxiousness and pallor. Pertinent negatives for hypoglycemia include no dizziness, headaches, sleepiness, speech difficulty, sweats or tremors. Associated symptoms include blurred vision, fatigue, polydipsia, polyuria, visual change, weakness and weight loss. Pertinent negatives for diabetes include no chest pain. (Blind in R eye) Symptoms are worsening. Pertinent negatives for diabetic complications include no autonomic neuropathy, CVA, PVD or retinopathy. When asked about current treatments, none were reported. His weight is decreasing steadily. He has not had a previous visit with a dietitian.   Past Medical History:  Diagnosis Date  . Diabetes mellitus without complication ([place])      No family history on file.   Social History   Socioeconomic History  . Marital status: Single    Spouse name: Not on file  . Number of children: Not on file  . Years of education: Not on file  . Highest education level: Not on file  Occupational History  . Not on file  Social Needs  . Financial resource strain: Not on file  . Food insecurity:    Worry: Not on file    Inability: Not on file  . Transportation needs:    Medical: Not on  file    Non-medical: Not on file  Tobacco Use  . Smoking status: Never Smoker  . Smokeless tobacco: Never Used  Substance and Sexual Activity  . Alcohol use: Not on file  . Drug use: Not on file  . Sexual activity: Not on file  Lifestyle  . Physical activity:    Days per week: Not on file    Minutes per session: Not on file  . Stress: Not on file  Relationships  . Social connections:    Talks on phone: Not on file    Gets together: Not on file    Attends religious service: Not on file    Active member of club or organization: Not on file    Attends meetings of clubs or organizations: Not on file    Relationship status: Not on file  . Intimate partner violence:    Fear of current or ex partner: Not on file    Emotionally abused: Not on file    Physically abused: Not on file    Forced sexual activity: Not on file  Other Topics Concern  . Not on file  Social History Narrative  . Not on file     No Known Allergies   Outpatient Medications Prior to Visit  Medication Sig Dispense Refill  . atorvastatin (LIPITOR) 10 MG tablet Take 1 tablet (10 mg total) by mouth daily. 30 tablet 0  . Multiple Vitamin (MULTIVITAMIN WITH MINERALS) TABS tablet Take 1 tablet by mouth daily.  30 tablet 0  . sodium bicarbonate 650 MG tablet Take 1 tablet (650 mg total) by mouth 2 (two) times daily. 60 tablet 1  . blood glucose meter kit and supplies KIT Dispense based on patient and insurance preference. Use up to four times daily as directed. (FOR ICD-9 250.00, 250.01). 1 each 0  . INS SYRINGE/NEEDLE .5CC/27G 27G X 1/2" 0.5 ML MISC Use as directed 60 each 3  . insulin NPH-regular Human (NOVOLIN 70/30) (70-30) 100 UNIT/ML injection Inject 8 Units into the skin 2 (two) times daily with a meal. 10 mL 3   No facility-administered medications prior to visit.       Review of Systems  Constitutional: Positive for fatigue and weight loss.  Eyes: Positive for blurred vision.  Cardiovascular: Negative  for chest pain.  Endocrine: Positive for polydipsia and polyuria.  Skin: Positive for pallor.  Neurological: Positive for weakness. Negative for dizziness, tremors, speech difficulty and headaches.  Psychiatric/Behavioral: The patient is nervous/anxious.        Objective:   Physical Exam Vitals:   12/30/17 1049  BP: (!) 205/125  Pulse: (!) 103  Resp: 18  Temp: (!) 97.4 F (36.3 C)  TempSrc: Oral  SpO2: 99%  Weight: 99 lb (44.9 kg)  Height: 5' 1"  (1.549 m)    Gen: Pleasant, thin cachectic asian male  in no distress,  normal affect  ENT: No lesions,  mouth clear,  oropharynx clear, no postnasal drip  Neck: No JVD, no TMG, no carotid bruits  Lungs: No use of accessory muscles, no dullness to percussion, clear without rales or rhonchi  Cardiovascular: RRR, heart sounds normal, no murmur or gallops, no peripheral edema  Abdomen: soft and NT, no HSM,  BS normal  Musculoskeletal: No deformities, no cyanosis or clubbing  Neuro: alert, non focal  Skin: Warm, no lesions or rashes  No results found.   All labs in hospital reviewed BMP Latest Ref Rng & Units 12/22/2017 12/21/2017 12/21/2017  Glucose 65 - 99 mg/dL 221(H) 149(H) 102(H)  BUN 6 - 20 mg/dL 61(H) 73(H) 78(H)  Creatinine 0.61 - 1.24 mg/dL 2.33(H) 2.39(H) 2.45(H)  Sodium 135 - 145 mmol/L 135 137 142  Potassium 3.5 - 5.1 mmol/L 4.0 3.5 3.6  Chloride 101 - 111 mmol/L 109 111 114(H)  CO2 22 - 32 mmol/L 18(L) 18(L) 20(L)  Calcium 8.9 - 10.3 mg/dL 8.2(L) 8.5(L) 8.6(L)   CBC Latest Ref Rng & Units 12/22/2017 12/21/2017 12/20/2017  WBC 4.0 - 10.5 K/uL 7.7 9.8 8.0  Hemoglobin 13.0 - 17.0 g/dL 9.7(L) 10.7(L) 10.7(L)  Hematocrit 39.0 - 52.0 % 29.2(L) 31.9(L) 31.1(L)  Platelets 150 - 400 K/uL 492(H) 562(H) 524(H)  Renal U/S 3/25 IMPRESSION: 1. Echogenic kidneys compatible with medical renal disease. No hydronephrosis. 2. Bladder wall thickening and debris, possibly reflecting infectious cystitis.  3/25 CXR:  NAD      Assessment & Plan:  I personally reviewed all images and lab data in the [facility] system as well as any outside material available during this office visit and agree with the  radiology impressions.   Hypertension Essential HTN with poorly controlled.  Gave 0.35m clonidine po with slight reduction in BP Plan Start clonidine 0.162mtid Start amlodipine 1097maily Return 24hrs for recheck  Diabetes mellitus with renal complications (HCC) DM2 with renal complications Poss DM retinopathy Plan Start NPH 70/30 units sq q12h Obtain glucose meter Needs f/u in 24hrs Gave Novolog 15 units with CBG down to 525 from > 600  Malnutrition  of moderate degree Severe weakness and weight loss  Plan Focus on DM rx and control    Nil was seen today for hospitalization follow-up.  Diagnoses and all orders for this visit:  Diabetes mellitus without complication (HCC) -     Glucose (CBG) -     insulin aspart (novoLOG) injection 15 Units -     Basic metabolic panel; Future -     Glucose (CBG) -     Basic metabolic panel  Hypertension, unspecified type -     cloNIDine (CATAPRES) tablet 0.2 mg -     cloNIDine (CATAPRES) tablet 0.1 mg -     Basic metabolic panel; Future -     Basic metabolic panel  Microcytic anemia -     CBC with Differential/Platelet; Future -     CBC with Differential/Platelet  Malnutrition of moderate degree  Acute hyperglycemia  Diabetes mellitus with complication in adult patient [facility])  Other orders -     sodium bicarbonate 650 MG tablet; Take 1 tablet (650 mg total) by mouth 2 (two) times daily. -     insulin NPH-regular Human (NOVOLIN 70/30) (70-30) 100 UNIT/ML injection; Inject 10 Units into the skin 2 (two) times daily with a meal. -     INS SYRINGE/NEEDLE .5CC/27G 27G X 1/2" 0.5 ML MISC; Use as directed -     atorvastatin (LIPITOR) 10 MG tablet; Take 1 tablet (10 mg total) by mouth daily. -     blood glucose meter kit and supplies KIT; Dispense based on  patient and insurance preference. Use up to four times daily as directed. (FOR ICD-9 250.00, 250.01). -     cloNIDine (CATAPRES) 0.1 MG tablet; Take 1 tablet (0.1 mg total) by mouth 3 (three) times daily. -     amLODipine (NORVASC) 10 MG tablet; Take 1 tablet (10 mg total) by mouth daily. -     blood glucose meter kit and supplies KIT; Dispense based on patient and insurance preference. Use up to four times daily as directed. (FOR ICD-9 250.00, 250.01).

## 2017-12-30 NOTE — Assessment & Plan Note (Signed)
Microcytic anemia F/u cbc

## 2017-12-30 NOTE — Patient Instructions (Addendum)
Start Novolog NPH 70/30 10 units subcutaneously twice daily at meal time Start clonidine 0.1 mg three times daily Start amlodipine 10mg  daily  Start Atorvastin one daily 10mg   Labs today BMP and CBC Return in tomorrow 12/31/17  for recheck

## 2017-12-30 NOTE — Assessment & Plan Note (Signed)
DM2 with renal complications Poss DM retinopathy Plan Start NPH 70/30 units sq q12h Obtain glucose meter Needs f/u in 24hrs Gave Novolog 15 units with CBG down to 525 from > 600

## 2017-12-30 NOTE — Assessment & Plan Note (Signed)
Essential HTN with poorly controlled.  Gave 0.3mg  clonidine po with slight reduction in BP Plan Start clonidine 0.1mg  tid Start amlodipine 10mg  daily Return 24hrs for recheck

## 2017-12-30 NOTE — Assessment & Plan Note (Signed)
Severe weakness and weight loss  Plan Focus on DM rx and control

## 2017-12-31 ENCOUNTER — Other Ambulatory Visit: Payer: Self-pay

## 2017-12-31 ENCOUNTER — Encounter (HOSPITAL_COMMUNITY): Payer: Self-pay | Admitting: *Deleted

## 2017-12-31 ENCOUNTER — Emergency Department (HOSPITAL_COMMUNITY)
Admission: EM | Admit: 2017-12-31 | Discharge: 2017-12-31 | Disposition: A | Discharge status: Home / Self Care | Payer: Medicaid Other | Attending: Emergency Medicine | Admitting: Emergency Medicine

## 2017-12-31 ENCOUNTER — Emergency Department (HOSPITAL_COMMUNITY): Payer: Medicaid Other

## 2017-12-31 DIAGNOSIS — E876 Hypokalemia: Secondary | ICD-10-CM

## 2017-12-31 DIAGNOSIS — N3001 Acute cystitis with hematuria: Secondary | ICD-10-CM

## 2017-12-31 DIAGNOSIS — N184 Chronic kidney disease, stage 4 (severe): Secondary | ICD-10-CM | POA: Insufficient documentation

## 2017-12-31 DIAGNOSIS — Z79899 Other long term (current) drug therapy: Secondary | ICD-10-CM | POA: Insufficient documentation

## 2017-12-31 DIAGNOSIS — E1122 Type 2 diabetes mellitus with diabetic chronic kidney disease: Secondary | ICD-10-CM | POA: Insufficient documentation

## 2017-12-31 DIAGNOSIS — E1165 Type 2 diabetes mellitus with hyperglycemia: Secondary | ICD-10-CM | POA: Diagnosis present

## 2017-12-31 DIAGNOSIS — I160 Hypertensive urgency: Secondary | ICD-10-CM

## 2017-12-31 DIAGNOSIS — I129 Hypertensive chronic kidney disease with stage 1 through stage 4 chronic kidney disease, or unspecified chronic kidney disease: Secondary | ICD-10-CM | POA: Insufficient documentation

## 2017-12-31 HISTORY — DX: Hyperlipidemia, unspecified: E78.5

## 2017-12-31 HISTORY — DX: Chronic kidney disease, stage 4 (severe): N18.4

## 2017-12-31 HISTORY — DX: Essential (primary) hypertension: I10

## 2017-12-31 LAB — CBC WITH DIFFERENTIAL/PLATELET
BASOS ABS: 0.1 10*3/uL (ref 0.0–0.2)
BASOS ABS: 0.1 10*3/uL (ref 0.0–0.1)
BASOS PCT: 1 %
Basos: 2 %
EOS (ABSOLUTE): 0.8 10*3/uL — AB (ref 0.0–0.4)
EOS ABS: 0.9 10*3/uL — AB (ref 0.0–0.7)
EOS PCT: 10 %
Eos: 11 %
HCT: 30.3 % — ABNORMAL LOW (ref 39.0–52.0)
HEMOGLOBIN: 10.3 g/dL — AB (ref 13.0–17.7)
HEMOGLOBIN: 9.9 g/dL — AB (ref 13.0–17.0)
Hematocrit: 32.7 % — ABNORMAL LOW (ref 37.5–51.0)
IMMATURE GRANS (ABS): 0 10*3/uL (ref 0.0–0.1)
Immature Granulocytes: 0 %
Lymphocytes Absolute: 1.3 10*3/uL (ref 0.7–3.1)
Lymphocytes Relative: 20 %
Lymphs Abs: 1.6 10*3/uL (ref 0.7–4.0)
Lymphs: 18 %
MCH: 25.9 pg — ABNORMAL LOW (ref 26.6–33.0)
MCH: 26.5 pg (ref 26.0–34.0)
MCHC: 31.5 g/dL (ref 31.5–35.7)
MCHC: 32.7 g/dL (ref 30.0–36.0)
MCV: 82 fL (ref 79–97)
MCV: 81 fL (ref 78.0–100.0)
MONOCYTES: 4 %
Monocytes Absolute: 0.3 10*3/uL (ref 0.1–0.9)
Monocytes Absolute: 0.4 10*3/uL (ref 0.1–1.0)
Monocytes Relative: 5 %
NEUTROS PCT: 64 %
Neutro Abs: 5.3 10*3/uL (ref 1.7–7.7)
Neutrophils Absolute: 4.8 10*3/uL (ref 1.4–7.0)
Neutrophils: 65 %
Platelets: 563 10*3/uL — ABNORMAL HIGH (ref 150–379)
Platelets: 574 10*3/uL — ABNORMAL HIGH (ref 150–400)
RBC: 3.97 x10E6/uL — AB (ref 4.14–5.80)
RBC: 3.74 MIL/uL — AB (ref 4.22–5.81)
RDW: 14.6 % (ref 12.3–15.4)
RDW: 14 % (ref 11.5–15.5)
WBC: 7.2 10*3/uL (ref 3.4–10.8)
WBC: 8.3 10*3/uL (ref 4.0–10.5)

## 2017-12-31 LAB — CBG MONITORING, ED
GLUCOSE-CAPILLARY: 450 mg/dL — AB (ref 65–99)
GLUCOSE-CAPILLARY: 397 mg/dL — AB (ref 65–99)
GLUCOSE-CAPILLARY: 142 mg/dL — AB (ref 65–99)
Glucose-Capillary: 311 mg/dL — ABNORMAL HIGH (ref 65–99)
Glucose-Capillary: 250 mg/dL — ABNORMAL HIGH (ref 65–99)

## 2017-12-31 LAB — BLOOD GAS, VENOUS
ACID-BASE DEFICIT: 2.2 mmol/L — AB (ref 0.0–2.0)
BICARBONATE: 23 mmol/L (ref 20.0–28.0)
FIO2: 21
O2 SAT: 83.7 %
PATIENT TEMPERATURE: 98.6
PH VEN: 7.34 (ref 7.250–7.430)
pCO2, Ven: 43.7 mmHg — ABNORMAL LOW (ref 44.0–60.0)
pO2, Ven: 52.9 mmHg — ABNORMAL HIGH (ref 32.0–45.0)

## 2017-12-31 LAB — BASIC METABOLIC PANEL
Anion gap: 8 (ref 5–15)
BUN / CREAT RATIO: 18 (ref 9–20)
BUN: 48 mg/dL — AB (ref 6–24)
BUN: 50 mg/dL — AB (ref 6–20)
CHLORIDE: 104 mmol/L (ref 101–111)
CO2: 21 mmol/L (ref 20–29)
CO2: 24 mmol/L (ref 22–32)
CREATININE: 2.72 mg/dL — AB (ref 0.76–1.27)
CREATININE: 2.58 mg/dL — AB (ref 0.61–1.24)
Calcium: 9 mg/dL (ref 8.7–10.2)
Calcium: 8.3 mg/dL — ABNORMAL LOW (ref 8.9–10.3)
Chloride: 101 mmol/L (ref 96–106)
GFR calc Af Amer: 30 mL/min/{1.73_m2} — ABNORMAL LOW (ref >59)
GFR calc Af Amer: 31 mL/min — ABNORMAL LOW (ref >60)
GFR calc non Af Amer: 26 mL/min/{1.73_m2} — ABNORMAL LOW (ref >59)
GFR, EST NON AFRICAN AMERICAN: 27 mL/min — AB (ref >60)
GLUCOSE: 507 mg/dL — AB (ref 65–99)
Glucose, Bld: 524 mg/dL (ref 65–99)
Potassium: 3.7 mmol/L (ref 3.5–5.2)
Potassium: 3.3 mmol/L — ABNORMAL LOW (ref 3.5–5.1)
SODIUM: 139 mmol/L (ref 134–144)
SODIUM: 136 mmol/L (ref 135–145)

## 2017-12-31 LAB — URINALYSIS, ROUTINE W REFLEX MICROSCOPIC
BILIRUBIN URINE: NEGATIVE
KETONES UR: NEGATIVE mg/dL
NITRITE: NEGATIVE
PH: 5 (ref 5.0–8.0)
Protein, ur: 100 mg/dL — AB
Specific Gravity, Urine: 1.01 (ref 1.005–1.030)
Squamous Epithelial / LPF: NONE SEEN

## 2017-12-31 MED ORDER — CLONIDINE HCL 0.1 MG PO TABS
0.1000 mg | ORAL_TABLET | Freq: Three times a day (TID) | ORAL | Status: DC
  Administered 2017-12-31: 0.1 mg via ORAL
  Filled 2017-12-31: qty 1

## 2017-12-31 MED ORDER — HYDRALAZINE HCL 20 MG/ML IJ SOLN
10.0000 mg | Freq: Once | INTRAMUSCULAR | Status: DC
  Filled 2017-12-31: qty 1

## 2017-12-31 MED ORDER — POTASSIUM CHLORIDE CRYS ER 20 MEQ PO TBCR
40.0000 meq | EXTENDED_RELEASE_TABLET | Freq: Once | ORAL | Status: AC
  Administered 2017-12-31: 40 meq via ORAL
  Filled 2017-12-31: qty 2

## 2017-12-31 MED ORDER — AMLODIPINE BESYLATE 5 MG PO TABS
10.0000 mg | ORAL_TABLET | Freq: Every day | ORAL | Status: DC
  Administered 2017-12-31: 10 mg via ORAL
  Filled 2017-12-31: qty 2

## 2017-12-31 MED ORDER — SODIUM CHLORIDE 0.9 % IV SOLN
INTRAVENOUS | Status: DC
  Administered 2017-12-31: 3.9 [IU]/h via INTRAVENOUS
  Administered 2017-12-31: 6.7 [IU]/h via INTRAVENOUS
  Administered 2017-12-31: 5 [IU]/h via INTRAVENOUS
  Filled 2017-12-31: qty 1

## 2017-12-31 MED ORDER — FOSFOMYCIN TROMETHAMINE 3 G PO PACK
3.0000 g | PACK | Freq: Once | ORAL | Status: AC
  Administered 2017-12-31: 3 g via ORAL
  Filled 2017-12-31: qty 3

## 2017-12-31 MED ORDER — SODIUM CHLORIDE 0.9 % IV BOLUS
1000.0000 mL | Freq: Once | INTRAVENOUS | Status: AC
  Administered 2017-12-31: 1000 mL via INTRAVENOUS

## 2017-12-31 MED ORDER — DEXTROSE-NACL 5-0.45 % IV SOLN: INTRAVENOUS | Status: DC

## 2017-12-31 MED ORDER — INSULIN ASPART PROT & ASPART (70-30 MIX) 100 UNIT/ML ~~LOC~~ SUSP
10.0000 [IU] | Freq: Once | SUBCUTANEOUS | Status: AC
  Administered 2017-12-31: 10 [IU] via SUBCUTANEOUS
  Filled 2017-12-31: qty 10

## 2017-12-31 MED ORDER — SODIUM CHLORIDE 0.9 % IV SOLN
INTRAVENOUS | Status: DC
  Administered 2017-12-31: 12:00:00 via INTRAVENOUS

## 2017-12-31 NOTE — ED Triage Notes (Signed)
Pt sent by Dr. Right (phone # [phone]) for elevated blood suger of 502, BP of 220/120. Pt does not have access to insulin, states he cannot afford it. Pt states he is thirsty, has increased urination. Pt denies pain.

## 2017-12-31 NOTE — ED Provider Notes (Addendum)
[place] DEPT Provider Note   CSN: [id number] Arrival date & time: 12/31/17  0932     History   Chief Complaint Chief Complaint  Patient presents with  . Hyperglycemia    HPI Nicholas Lucero is a 52 y.o. male.  Pt presents to the ED today with elevated blood sugar.  He speaks [place] and mostly understands [place].  His daughter translates what he does not understand.  He did not want an interpreter.   The pt was admitted to the hospital from 3/24-26 for new onset type 2 DM.  Blood sugar responded to insulin and he was d/c with 70/30 insulin.  The pt does not have medical insurance and [facility]  Pt followed up yesterday and saw Dr. Joya Gaskins.  During that appointment, he revealed that he has been unable to get any of his medications due to cost.  Pt does not work and his daughter is a Ship broker and works part time only.  Dr. Joya Gaskins gave him 0.3 mg clonidine in the clinic yesterday as well as novolog 15 units.  Pt was told to return today for recheck.  Blood sugar and blood pressure are still high, so Dr. Joya Gaskins sent him here for admission to get both issues under control and to help with his meds upon d/c.  Pt denies any pain.  He feels generally weak.             Past Medical History:  Diagnosis Date  . Chronic kidney disease, stage IV (severe) ([place])   . Diabetes mellitus without complication ([place])   . Essential hypertension   . Hyperlipidemia     Patient Active Problem List   Diagnosis Date Noted  . Diabetes mellitus with renal complications ([place]) 52/84/1324  . Hypertension 12/30/2017  . Microcytic anemia 12/30/2017  . Malnutrition of moderate degree 12/22/2017  . Acute hyperglycemia 12/20/2017    History reviewed. No pertinent surgical history.      Home Medications    Prior to Admission medications   Medication Sig Start Date End Date Taking? Authorizing  Provider  amLODipine (NORVASC) 10 MG tablet Take 1 tablet (10 mg total) by mouth daily. 12/30/17  Yes Elsie Stain, MD  atorvastatin (LIPITOR) 10 MG tablet Take 1 tablet (10 mg total) by mouth daily. 12/30/17  Yes Elsie Stain, MD  cloNIDine (CATAPRES) 0.1 MG tablet Take 1 tablet (0.1 mg total) by mouth 3 (three) times daily. 12/30/17  Yes Elsie Stain, MD  insulin NPH-regular Human (NOVOLIN 70/30) (70-30) 100 UNIT/ML injection Inject 10 Units into the skin 2 (two) times daily with a meal. 12/30/17  Yes Elsie Stain, MD  Multiple Vitamin (MULTIVITAMIN WITH MINERALS) TABS tablet Take 1 tablet by mouth daily. 12/22/17  Yes Bonnielee Haff, MD  sodium bicarbonate 650 MG tablet Take 1 tablet (650 mg total) by mouth 2 (two) times daily. 12/30/17  Yes Elsie Stain, MD  blood glucose meter kit and supplies KIT Dispense based on patient and insurance preference. Use up to four times daily as directed. (FOR ICD-9 250.00, 250.01). 12/30/17   Elsie Stain, MD  blood glucose meter kit and supplies KIT Dispense based on patient and insurance preference. Use up to four times daily as directed. (FOR ICD-9 250.00, 250.01). 12/30/17   Elsie Stain, MD  INS SYRINGE/NEEDLE .5CC/27G 27G X 1/2" 0.5 ML MISC Use as directed 12/30/17   Elsie Stain, MD  Family History Family History  Problem Relation Age of Onset  . Diabetes Mellitus II Sister   . Diabetes Mellitus II Brother     Social History Social History   Tobacco Use  . Smoking status: Never Smoker  . Smokeless tobacco: Never Used  Substance Use Topics  . Alcohol use: Not Currently  . Drug use: Never     Allergies   Patient has no known allergies.   Review of Systems Review of Systems  Constitutional: Positive for fatigue.  Neurological: Positive for weakness.  All other systems reviewed and are negative.    Physical Exam Updated Vital Signs BP (!) 150/89   Pulse 85   Temp 98 F (36.7 C)   Resp 14   Ht 5' 1"   (1.549 m)   Wt 44.9 kg (99 lb)   SpO2 100%   BMI 18.71 kg/m   Physical Exam  Constitutional: He is oriented to person, place, and time. He appears well-developed and well-nourished.  HENT:  Head: Normocephalic and atraumatic.  Right Ear: External ear normal.  Left Ear: External ear normal.  Nose: Nose normal.  Mouth/Throat: Oropharynx is clear and moist.  Eyes: Conjunctivae and EOM are normal.  Right eye blind (chronic)  Neck: Normal range of motion. Neck supple.  Cardiovascular: Normal rate, regular rhythm, normal heart sounds and intact distal pulses.  Pulmonary/Chest: Effort normal and breath sounds normal.  Abdominal: Soft. Bowel sounds are normal.  Musculoskeletal: Normal range of motion.  Neurological: He is alert and oriented to person, place, and time.  Skin: Skin is warm and dry. Capillary refill takes less than 2 seconds.  Psychiatric: He has a normal mood and affect. His behavior is normal. Judgment and thought content normal.  Nursing note and vitals reviewed.    ED Treatments / Results  Labs (all labs ordered are listed, but only abnormal results are displayed) Labs Reviewed  BASIC METABOLIC PANEL - Abnormal; Notable for the following components:      Result Value   Potassium 3.3 (*)    Glucose, Bld 524 (*)    BUN 50 (*)    Creatinine, Ser 2.58 (*)    Calcium 8.3 (*)    GFR calc non Af Amer 27 (*)    GFR calc Af Amer 31 (*)    All other components within normal limits  BLOOD GAS, VENOUS - Abnormal; Notable for the following components:   pCO2, Ven 43.7 (*)    pO2, Ven 52.9 (*)    Acid-base deficit 2.2 (*)    All other components within normal limits  CBC WITH DIFFERENTIAL/PLATELET - Abnormal; Notable for the following components:   RBC 3.74 (*)    Hemoglobin 9.9 (*)    HCT 30.3 (*)    Platelets 574 (*)    Eosinophils Absolute 0.9 (*)    All other components within normal limits  URINALYSIS, ROUTINE W REFLEX MICROSCOPIC - Abnormal; Notable for the  following components:   Color, Urine AMBER (*)    APPearance HAZY (*)    Glucose, UA >=500 (*)    Hgb urine dipstick MODERATE (*)    Protein, ur 100 (*)    Leukocytes, UA LARGE (*)    Bacteria, UA MANY (*)    All other components within normal limits  CBG MONITORING, ED - Abnormal; Notable for the following components:   Glucose-Capillary 450 (*)    All other components within normal limits  CBG MONITORING, ED - Abnormal; Notable for the following components:  Glucose-Capillary 397 (*)    All other components within normal limits  CBG MONITORING, ED - Abnormal; Notable for the following components:   Glucose-Capillary 311 (*)    All other components within normal limits  CBG MONITORING, ED - Abnormal; Notable for the following components:   Glucose-Capillary 250 (*)    All other components within normal limits  URINE CULTURE  CBG MONITORING, ED    EKG EKG Interpretation  Date/Time:  Thursday December 31 2017 15:08:44 EDT Ventricular Rate:  89 PR Interval:    QRS Duration: 63 QT Interval:  411 QTC Calculation: 501 R Axis:   46 Text Interpretation:  Age not entered, assumed to be  52 years old for purpose of ECG interpretation Sinus rhythm Low voltage, extremity leads Nonspecific T abnormalities, lateral leads Prolonged QT interval No significant change since last tracing Confirmed by Isla Pence [phone]) on 12/31/2017 3:21:17 PM   Radiology Dg Chest 2 View  Result Date: 12/31/2017 CLINICAL DATA:  Shortness of breath EXAM: CHEST - 2 VIEW COMPARISON:  12/20/2017 FINDINGS: Normal heart size and mediastinal contours. No acute infiltrate or edema. No effusion or pneumothorax. No acute osseous findings. Artifact from EKG leads. IMPRESSION: Negative chest. Electronically Signed   By: Monte Fantasia M.D.   On: 12/31/2017 10:18    Procedures Procedures (including critical care time)  Medications Ordered in ED Medications  insulin regular (NOVOLIN R,HUMULIN R) 100 Units in sodium  chloride 0.9 % 100 mL (1 Units/mL) infusion (0 Units/hr Intravenous Stopped 12/31/17 1515)  amLODipine (NORVASC) tablet 10 mg (10 mg Oral Given 12/31/17 1255)  cloNIDine (CATAPRES) tablet 0.1 mg (0.1 mg Oral Given 12/31/17 1256)  sodium chloride 0.9 % bolus 1,000 mL (0 mLs Intravenous Stopped 12/31/17 1200)  potassium chloride SA (K-DUR,KLOR-CON) CR tablet 40 mEq (40 mEq Oral Given 12/31/17 1256)  insulin aspart protamine- aspart (NOVOLOG MIX 70/30) injection 10 Units (10 Units Subcutaneous Given 12/31/17 1410)  fosfomycin (MONUROL) packet 3 g (3 g Oral Given 12/31/17 1554)     Initial Impression / Assessment and Plan / ED Course  I have reviewed the triage vital signs and the nursing notes.  Pertinent labs & imaging results that were available during my care of the patient were reviewed by me and considered in my medical decision making (see chart for details).    CRITICAL CARE Performed by: Isla Pence   Total critical care time: 30 minutes  Critical care time was exclusive of separately billable procedures and treating other patients.  Critical care was necessary to treat or prevent imminent or life-threatening deterioration.  Critical care was time spent personally by me on the following activities: development of treatment plan with patient and/or surrogate as well as nursing, discussions with consultants, evaluation of patient's response to treatment, examination of patient, obtaining history from patient or surrogate, ordering and performing treatments and interventions, ordering and review of laboratory studies, ordering and review of radiographic studies, pulse oximetry and re-evaluation of patient's condition.  Pt is not in DKA.  Pt started on an insulin drip for hyperglycemia and he's given hydralazine for bp.  He is also hypokalemic, so that was replaced.  Pt d/w Dr. Sheran Fava ([facility]) for admission.  Dr. Sheran Fava was able to get pt all of his meds [facility]  His daughter has been able to pick those up.  The pt's bs and bp has improved.  Final Clinical Impressions(s) / ED Diagnoses   Final diagnoses:  Type  2 diabetes mellitus with hyperglycemia, without long-term current use of insulin (HCC)  Hypertensive urgency  CKD (chronic kidney disease) stage 4, GFR 15-29 ml/min (HCC)  Hypokalemia  Acute cystitis with hematuria    ED Discharge Orders    None       Isla Pence, MD 12/31/17 1223    Isla Pence, MD 12/31/17 [phone]

## 2017-12-31 NOTE — ED Notes (Signed)
RN notified of abnormal lab 

## 2017-12-31 NOTE — ED Notes (Signed)
Patient has a urinal at the bedside, patient states he doesn't have to use the restroom at the moment and when he does he will use the urinal and let us know so that he will be able to get a urine sample.

## 2017-12-31 NOTE — Progress Notes (Signed)
Inpatient Diabetes Program Recommendations  AACE/ADA: New Consensus Statement on Inpatient Glycemic Control (2015)  Target Ranges:  Prepandial:   less than 140 mg/dL      Peak postprandial:   less than 180 mg/dL (1-2 hours)      Critically ill patients:  140 - 180 mg/dL   Lab Results  Component Value Date   GLUCAP 250 (H) 12/31/2017   HGBA1C 18.4 (H) 12/21/2017    Review of Glycemic Control  Diabetes history: Newly-diagnosed DM Outpatient Diabetes medications: 70/30 10 units bid (did not get prescription filled) Current orders for Inpatient glycemic control: Transitioning off insulin drip. 70/30 10 units bid  Pt and [facility]. [facility], along with purchasing meter and supplies. Pt and daughter agreed to get prescriptions filled.   Will speak with daughter in am to make certain there is not a communication breakdown and she and pt understand that it's imperative to fill prescription to control blood sugars.   Follow.  Thank you. Ailene Ardshonda Jaslene Marsteller, RD, LDN, CDE Inpatient Diabetes Coordinator [phone]

## 2017-12-31 NOTE — Care Management Note (Addendum)
Case Management Note  CM consulted for follow up visit.  CM was advised that Dr. Delford FieldWright sent pt for admission. Dr. Malachi BondsShort decided not to admit but wants pt to follow up tomorrow or Monday.  [facility] for follow [facility].  No appointments available but the staff member would let their lead scheduler know the pt's situation.  CM additionally sent a note to [facility] CM to assist with pt's follow up appointment.  CM spoke with pt and daughter at bedside advising them someone would call as soon as an appointment is available.  CM additionally advised the pt and daughter to use the [facility] for their pharmacy needs and for financial counseling.  Updated Dr. Particia NearingHaviland.  No further CM needs noted at this time.

## 2017-12-31 NOTE — Consult Note (Addendum)
[facility] Medical Consultation  Nicholas Lucero TWS:[id number] DOB: 12/09/1965 DOA: 12/31/2017 PCP: Patient, No Pcp Per   Requesting physician: Isla Pence Date of consultation: 12/31/2017 Reason for consultation: management of diabetes and hypertension  Impression/Recommendations  Diabetes mellitus type 2 with hyperglycemia, A1c 18.4 on 3/25 -  Transition off insulin gtt and start 70/30 insulin which was previously prescribed.   -  F/u with community health [facility] wellness within 1 week to review blood sugars  Essential hypertension, blood pressures trending down without intervention -We will give dose of Norvasc and clonidine -Repeat blood pressure 1 hour later -Daughter to go pick up prescriptions for insulin, blood pressure medications from the pharmacy  Chronic kidney disease stage IV, creatinine stable from previous admission at 2.58.  Bicarbonate level has normalized but he has developed lower extremity swelling -Needs referral to nephrology -Discontinue sodium bicarbonate for now  Abnormal urinalysis with polyuria and polydipsia, may have UTI -Fosfomycin x1 -Urine culture  Bilateral lower extremity edema, may be secondary to chronic kidney disease and sodium bicarbonate use -Discontinue sodium bicarbonate -Recommend low-sodium diet -Consider echocardiogram as outpatient as pt at risk for systolic and diastolic heart failure  Normocytic anemia, likely secondary to chronic kidney disease -Needs referral to nephrology for possible EPO injections -Iron levels were normal during last visit in late March  Hyperlipidemia, recently started on atorvastatin  Once transitioned of insulin gtt, CBG and BP trending down, patient may be discharged with close follow up with community health and wellness.  I personally called the pharmacy and found out that the patient's first month prescription is free for his blood pressure and diabetes medications.His subsequent months  medications will cost him about $28 which both the daughter and patient stated that he could afford.  Chief Complaint: sent from clinic for high blood sugar  HPI:   The patient is a 52 year old male who speaks some English and uses his daughter as an Astronomer.  He recently presented to the hospital on 3/22 due to weakness and weight loss and was found to have high cholesterol, high blood pressure, chronic kidney disease, and diabetes.  He was not in DKA but was started on an insulin infusion for hyperglycemia and was discharged with prescriptions for 70/30 insulin, sodium bicarbonate, and atorvastatin.  He picked up his prescriptions for his atorvastatin and sodium bicarbonate but did not fill his insulin prescription.  He presented to community health and wellness for routine follow-up and was seen by Dr. Joya Gaskins on 4/3.  At that time he was hypertensive and hyperglycemic.  He was given prescription's for clonidine 0.1 mg 3 times daily, Norvasc 10 mg daily and was administered administered clonidine 0.3 mg and aspart 15 units in clinic and told to follow-up again today.  He did not fill his prescriptions again yesterday or this morning and so therefore was again hypertensive and hyperglycemic when he arrived in clinic so he was referred to the emergency department.  The patient denies headache, changes to his vision, new numbness, tingling, or weakness, confusion, chest pains, shortness of breath.  He states he has been feeling much better since he was discharged from the hospital and even though he is still somewhat weak he has been moving furniture and mowing the grass.  He feels fine and would like to be able to go home if possible.  Review of Systems: Increased lower extremity swelling since his discharge from last hospitalization, otherwise no fevers, chills, sinus congestion, sore throat, cough, wheeze, nausea, diarrhea.  Having  polyuria and polydipsia.  12 point review of systems reviewed with  patient and negative except as mentioned above.    Past Medical History:  Diagnosis Date  . Chronic kidney disease, stage IV (severe) ([place])   . Diabetes mellitus without complication ([place])   . Essential hypertension   . Hyperlipidemia    History reviewed. No pertinent surgical history. Social History:  reports that he has never smoked. He has never used smokeless tobacco. He reports that he drank alcohol. He reports that he does not use drugs.  No Known Allergies Family History  Problem Relation Age of Onset  . Diabetes Mellitus II Sister   . Diabetes Mellitus II Brother   numerous family members with diabetes.    Prior to Admission medications   Medication Sig Start Date End Date Taking? Authorizing Provider  amLODipine (NORVASC) 10 MG tablet Take 1 tablet (10 mg total) by mouth daily. 12/30/17  Yes Elsie Stain, MD  atorvastatin (LIPITOR) 10 MG tablet Take 1 tablet (10 mg total) by mouth daily. 12/30/17  Yes Elsie Stain, MD  cloNIDine (CATAPRES) 0.1 MG tablet Take 1 tablet (0.1 mg total) by mouth 3 (three) times daily. 12/30/17  Yes Elsie Stain, MD  insulin NPH-regular Human (NOVOLIN 70/30) (70-30) 100 UNIT/ML injection Inject 10 Units into the skin 2 (two) times daily with a meal. 12/30/17  Yes Elsie Stain, MD  Multiple Vitamin (MULTIVITAMIN WITH MINERALS) TABS tablet Take 1 tablet by mouth daily. 12/22/17  Yes Bonnielee Haff, MD  sodium bicarbonate 650 MG tablet Take 1 tablet (650 mg total) by mouth 2 (two) times daily. 12/30/17  Yes Elsie Stain, MD  blood glucose meter kit and supplies KIT Dispense based on patient and insurance preference. Use up to four times daily as directed. (FOR ICD-9 250.00, 250.01). 12/30/17   Elsie Stain, MD  blood glucose meter kit and supplies KIT Dispense based on patient and insurance preference. Use up to four times daily as directed. (FOR ICD-9 250.00, 250.01). 12/30/17   Elsie Stain, MD  INS SYRINGE/NEEDLE .5CC/27G 27G X  1/2" 0.5 ML MISC Use as directed 12/30/17   Elsie Stain, MD   Physical Exam: Blood pressure (!) 160/99, pulse 90, temperature 98 F (36.7 C), resp. rate 20, height 5' 1"  (1.549 m), weight 44.9 kg (99 lb), SpO2 98 %. 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  ENT:  Nares clear.  OP clear, non-erythematous without plaques or exudates.  MMM.  Neck:  Supple without TM or JVD.    Lymph:  No cervical, supraclavicular, or submandibular LAD.  Cardiovascular:  RRR, normal S1, S2, possible gallop.  2+ pulses, warm extremities  Respiratory:  CTA bilaterally without increased WOB.  Abdomen:  NABS.  Soft, ND/NT.    Skin:  No rashes or focal lesions.  Musculoskeletal:  Normal bulk and tone.  1+ pitting bilateral LE edema.  Psychiatric:  A & O x 4.  Appropriate affect.  Neurologic:  CN 3-12 intact.  5-/5 strength throughout.  Sensation intact to  light touch  Labs on Admission:  Basic Metabolic Panel: Recent Labs  Lab 12/30/17 1155 12/31/17 1042  NA 139 136  K 3.7 3.3*  CL 101 104  CO2 21 24  GLUCOSE 507* 524*  BUN 48* 50*  CREATININE 2.72* 2.58*  CALCIUM 9.0 8.3*   Liver Function Tests: No results for input(s): AST, ALT, ALKPHOS, BILITOT, PROT, ALBUMIN in the last 168 hours. No results for input(s): LIPASE, AMYLASE in the last 168 hours. No results for input(s): AMMONIA in the last 168 hours. CBC: Recent Labs  Lab 12/30/17 1155 12/31/17 1042  WBC 7.2 8.3  NEUTROABS 4.8 5.3  HGB 10.3* 9.9*  HCT 32.7* 30.3*  MCV 82 81.0  PLT 563* 574*   Cardiac Enzymes: No results for input(s): CKTOTAL, CKMB, CKMBINDEX, TROPONINI in the last 168 hours. BNP: Invalid  input(s): POCBNP CBG: Recent Labs  Lab 12/31/17 1146 12/31/17 1303  GLUCAP 450* 397*    Radiological Exams on Admission: Dg Chest 2 View  Result Date: 12/31/2017 CLINICAL DATA:  Shortness of breath EXAM: CHEST - 2 VIEW COMPARISON:  12/20/2017 FINDINGS: Normal heart size and mediastinal contours. No acute infiltrate or edema. No effusion or pneumothorax. No acute osseous findings. Artifact from EKG leads. IMPRESSION: Negative chest. Electronically Signed   By: Monte Fantasia M.D.   On: 12/31/2017 10:18   ECG: Independently reviewed: Normal sinus rhythm with flattened T waves in the inferior and lateral leads.  Similar to prior.  No evidence of acute ischemia.  Time spent: 75 min  [facility] Pager [phone]  If 7PM-7AM, please contact night-coverage [email or web address] Password [email or web address] 12/31/2017, 1:06 PM

## 2018-01-02 LAB — URINE CULTURE: Culture: >=100000 — AB

## 2018-01-03 ENCOUNTER — Telehealth: Payer: Self-pay

## 2018-01-03 ENCOUNTER — Encounter: Payer: Self-pay | Admitting: Critical Care Medicine

## 2018-01-03 NOTE — Telephone Encounter (Signed)
Post ED Visit - Positive Culture Follow-up  Culture report reviewed by antimicrobial stewardship pharmacist:  []  Enzo BiNathan Batchelder, Pharm.D. []  Celedonio MiyamotoJeremy Frens, Pharm.D., BCPS AQ-ID []  Garvin FilaMike Maccia, Pharm.D., BCPS [x]  Georgina PillionElizabeth Martin, Pharm.D., BCPS []  MoultonMinh Pham, VermontPharm.D., BCPS, AAHIVP []  Estella HuskMichelle Turner, Pharm.D., BCPS, AAHIVP []  Lysle Pearlachel Rumbarger, PharmD, BCPS []  Blake DivineShannon Parkey, PharmD []  Pollyann SamplesAndy Johnston, PharmD, BCPS  Positive urine culture Treated with Fosfomycin x 1, organism sensitive to the same and no further patient follow-up is required at this time.  Jerry CarasCullom, Leila Schuff Burnett 01/03/2018, 11:43 AM

## 2018-01-04 ENCOUNTER — Telehealth: Payer: Self-pay

## 2018-01-04 NOTE — Telephone Encounter (Signed)
Call received from the patient's daughter, Hulan SaasLee Anne.  She was inquiring about an appointment for her father. An appointment was scheduled for 01/06/18 @ 1000 with Dr Alvis LemmingsNewlin.  Hulan SaasLee Anne said she would make sure that he got to the appointment and she would bring all of his medications with them.   She noted that they picked up her father's medications on Thursday, 12/31/17 and he has been taking all of them including the insulin.  She explained that she does not live with him but he is living with her uncle. When asked if he has been checking his blood sugar, she said yes and noted that yesterday he was " sweaty and not feeling good."  She said that his blood sugar was 30's or 40's. She was not sure as she is not with him and is relying on other family to report to her.  She said that he didn't eat when he took the insulin but he now knows to eat. This CM reviewed what to do when he is experiencing low blood sugars.  She said that she is not sure what his blood sugar is today but she saw him later yesterday and he was doing much better. She also explained that Saturday her family reported to her that " he was swollen." she said that she was informed that his face and eyeballs were swollen, not his ankles. She is not sure if it was due to any medication and then noted that when she saw  him yesterday, " he wasn't swollen".  Informed her that the provider would be notified of these observations.  Also informed her that he would be scheduled to [facility] after meeting with the provider on 01/06/18.

## 2018-01-04 NOTE — Telephone Encounter (Signed)
Message received from Eldridge Abrahams, RN CM regarding the patient's need for a follow up appointment at [facility]  As per Aldean Jewett, [facility] Front Office Team Lead, the patient was contacted on 12/31/17 by Paulene Floor, [facility] scheduler to inform him that an appointment was available on 01/01/18 with Dr Laural Benes for follow up as recommended by ED provider. Aldean Jewett stated that the patient refused the appointment and said he would call back Friday, 01/01/18; but he never called back.   This CM informed A. Kritzer, RN CM that [facility] does not have any follow up appointments available at this time.

## 2018-01-06 ENCOUNTER — Ambulatory Visit: Payer: Self-pay | Admitting: Family Medicine

## 2018-01-06 ENCOUNTER — Ambulatory Visit: Payer: Self-pay | Admitting: Pharmacist

## 2018-05-12 DIAGNOSIS — I10 Essential (primary) hypertension: Secondary | ICD-10-CM

## 2019-09-21 IMAGING — CR DG CHEST 2V
2 series · 2 of 2 positions shown · non-contrast
Comparison: 12/20/2017

CLINICAL DATA: Shortness of breath

EXAM:
CHEST - 2 VIEW

[w chest pa]
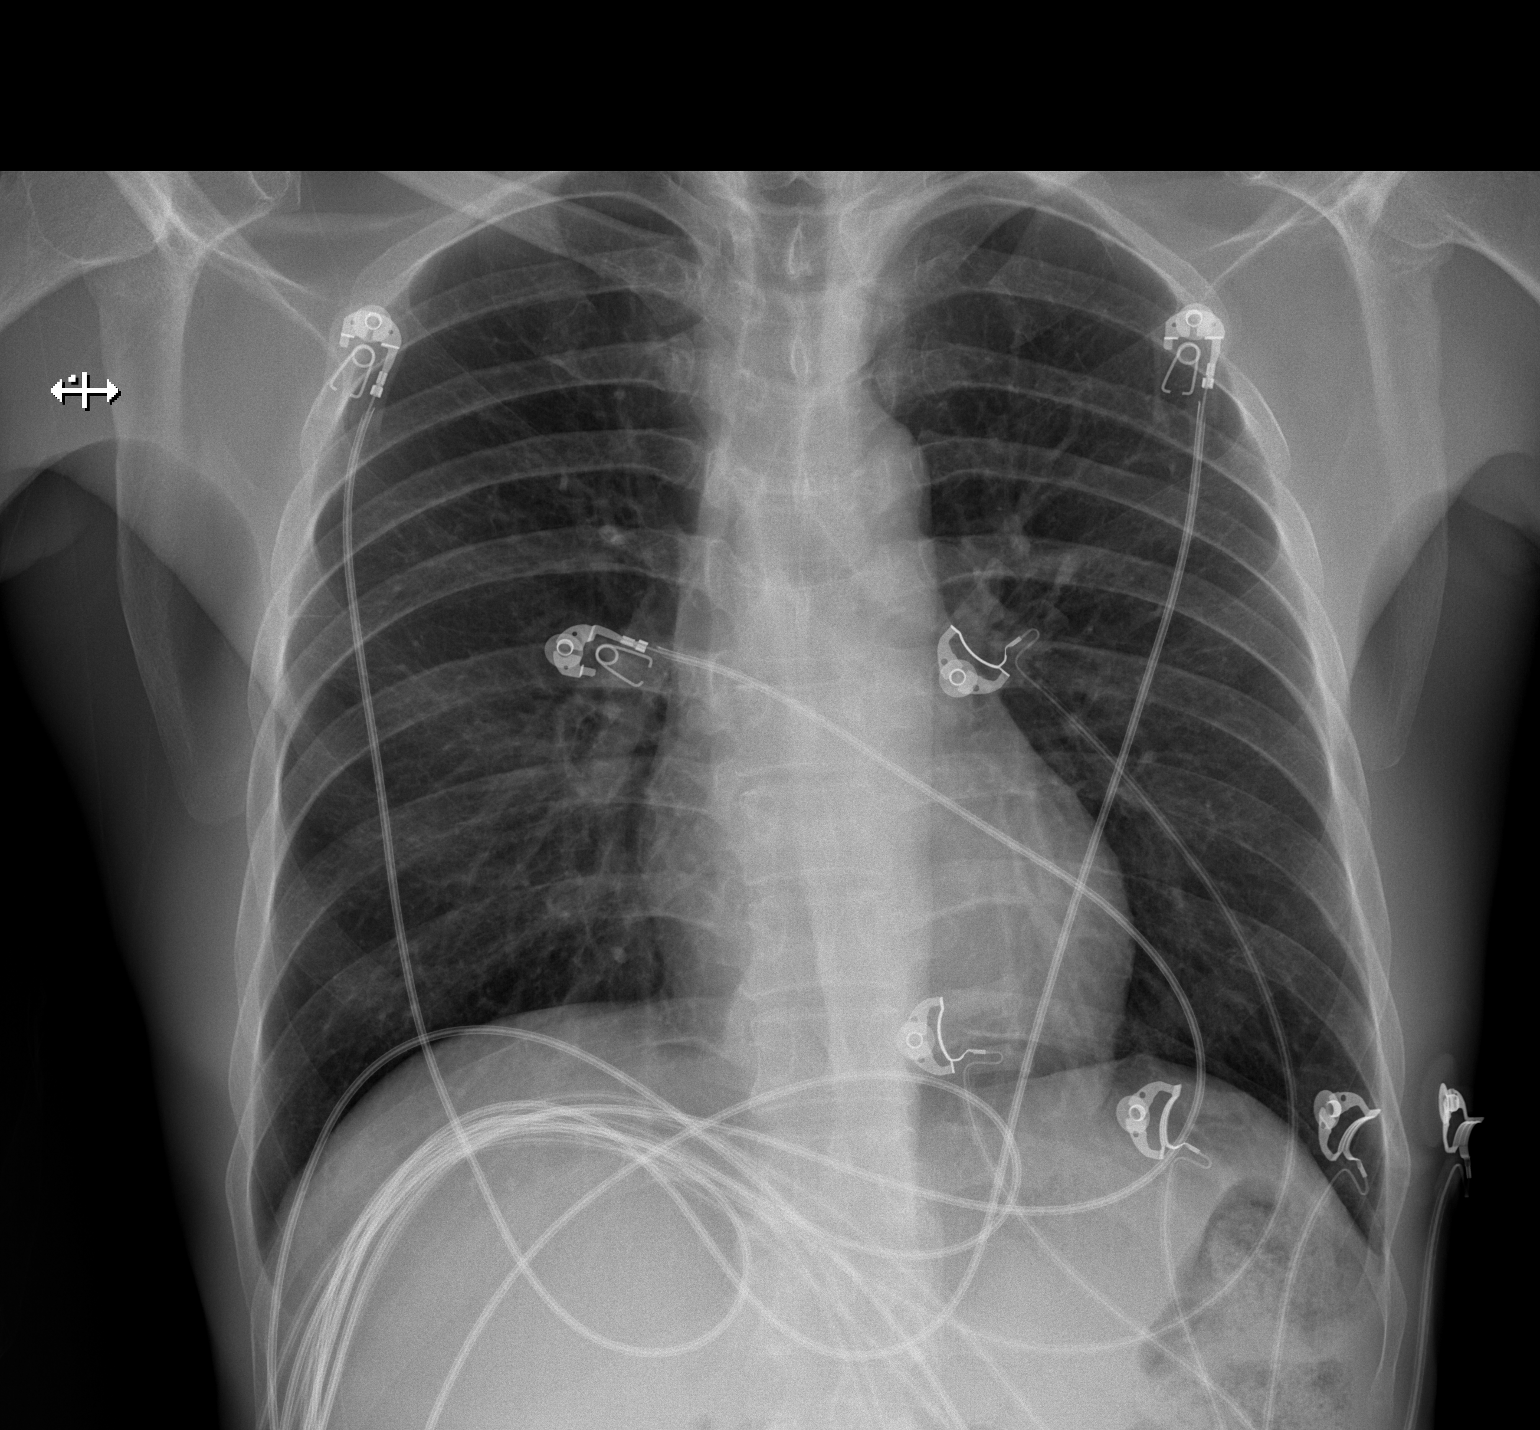

[w chest lat]
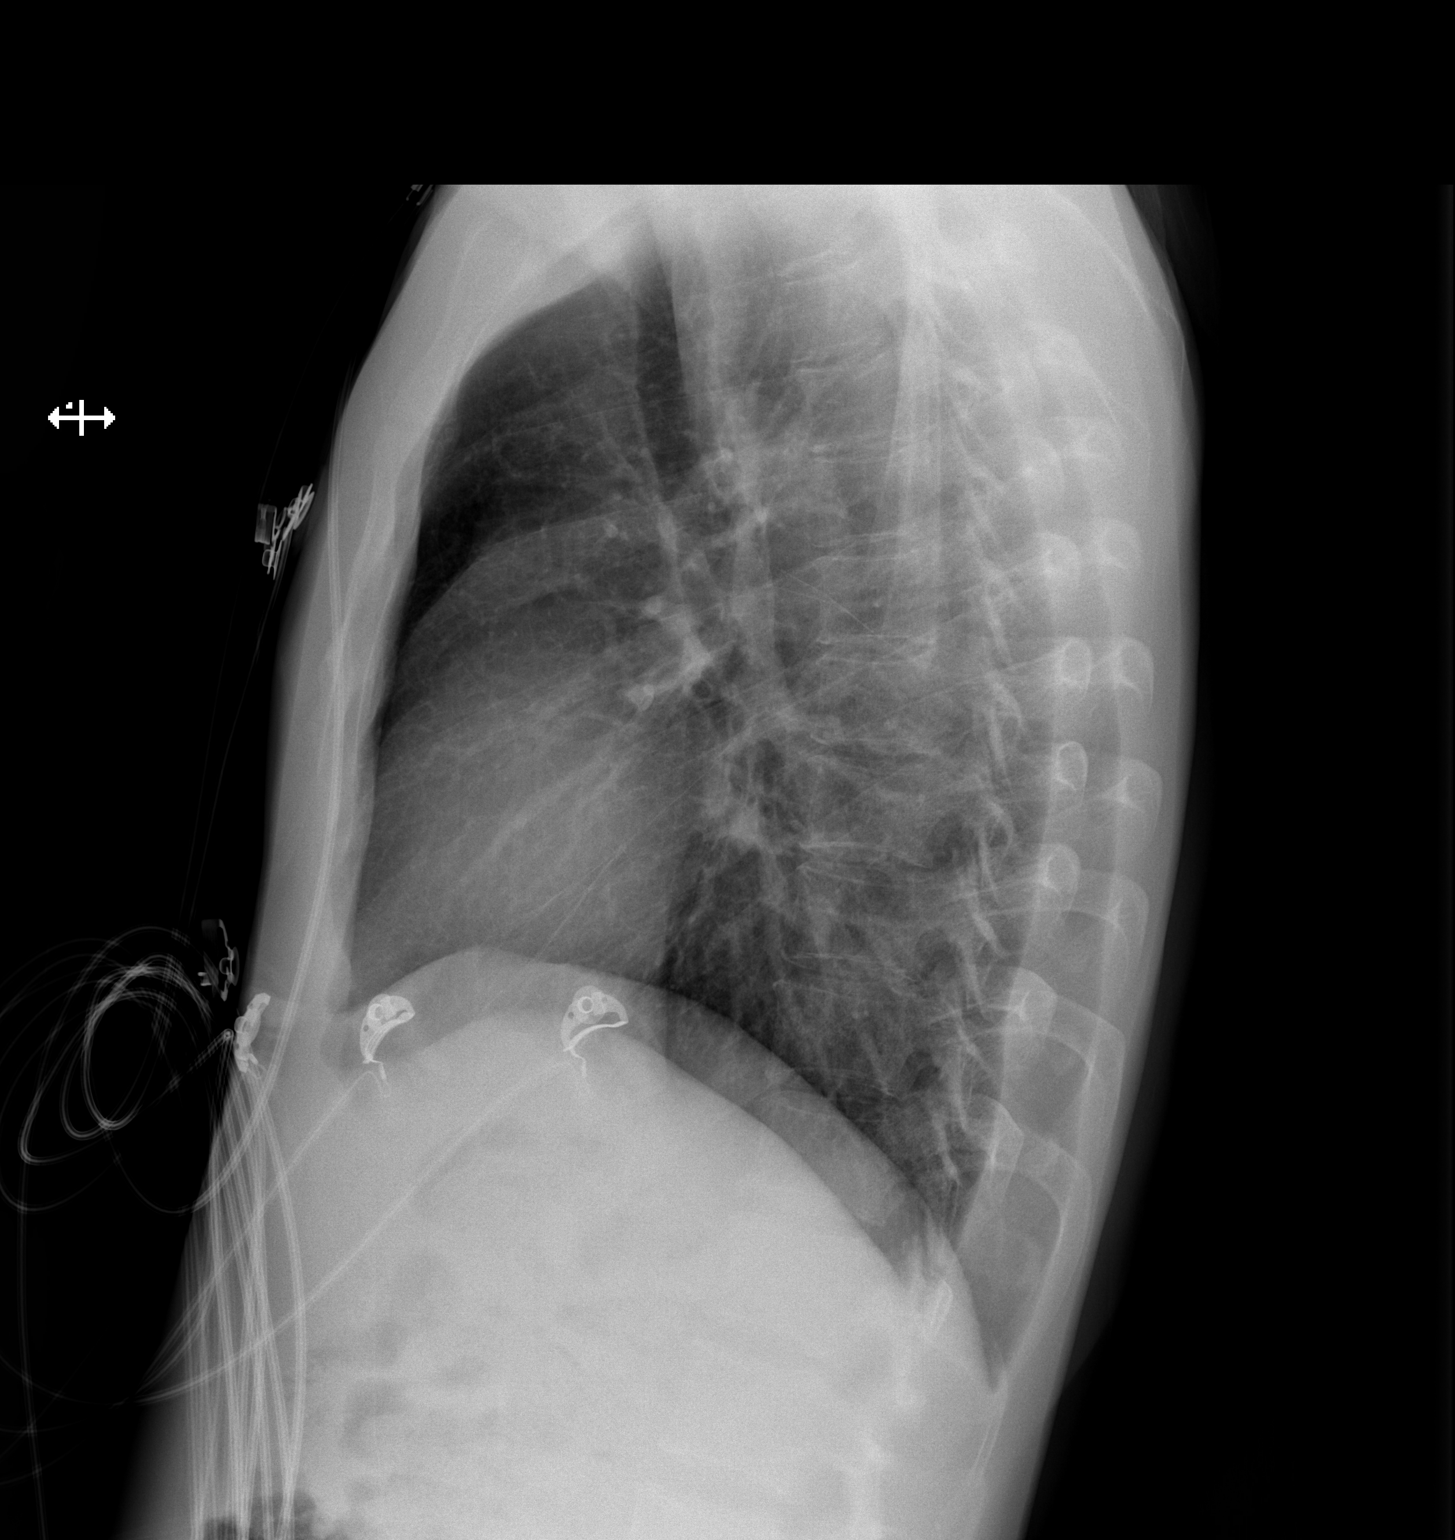

[2 of 2 positions shown; findings below may reference images not displayed]

FINDINGS: Normal heart size and mediastinal contours. No acute infiltrate or
edema. No effusion or pneumothorax. No acute osseous findings.
Artifact from EKG leads.
IMPRESSION: Negative chest.
